# Patient Record
Sex: Female | Born: 1955 | Race: White | Hispanic: No | Marital: Married | Smoking: Former smoker
Health system: Southern US, Community
[De-identification: ages and names within clinical notes are randomized; demographics above are authoritative.]

## PROBLEM LIST (undated history)

## (undated) DIAGNOSIS — G90512 Complex regional pain syndrome I of left upper limb: Secondary | ICD-10-CM

## (undated) DIAGNOSIS — G5600 Carpal tunnel syndrome, unspecified upper limb: Secondary | ICD-10-CM

## (undated) DIAGNOSIS — G43909 Migraine, unspecified, not intractable, without status migrainosus: Secondary | ICD-10-CM

## (undated) DIAGNOSIS — H33329 Round hole, unspecified eye: Secondary | ICD-10-CM

## (undated) DIAGNOSIS — I214 Non-ST elevation (NSTEMI) myocardial infarction: Secondary | ICD-10-CM

## (undated) DIAGNOSIS — R011 Cardiac murmur, unspecified: Secondary | ICD-10-CM

## (undated) HISTORY — PX: TUBAL LIGATION: SHX77

## (undated) HISTORY — PX: CARPAL TUNNEL RELEASE: SHX101

## (undated) HISTORY — PX: VAGINAL HYSTERECTOMY: SUR661

## (undated) HISTORY — PX: RHINOPLASTY: SUR1284

---
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
## 2017-05-01 ENCOUNTER — Emergency Department (HOSPITAL_COMMUNITY): Payer: 59

## 2017-05-01 ENCOUNTER — Inpatient Hospital Stay (HOSPITAL_COMMUNITY)
Admission: EM | Admit: 2017-05-01 | Discharge: 2017-05-04 | DRG: 286 | Disposition: A | Discharge status: Home / Self Care | Payer: 59 | Attending: Internal Medicine | Admitting: Internal Medicine

## 2017-05-01 ENCOUNTER — Encounter (HOSPITAL_COMMUNITY): Payer: Self-pay

## 2017-05-01 ENCOUNTER — Encounter (HOSPITAL_COMMUNITY)
Admission: EM | Disposition: A | Discharge status: Home / Self Care | Payer: Self-pay | Source: Home / Self Care | Attending: Internal Medicine

## 2017-05-01 DIAGNOSIS — Z79891 Long term (current) use of opiate analgesic: Secondary | ICD-10-CM

## 2017-05-01 DIAGNOSIS — I4581 Long QT syndrome: Secondary | ICD-10-CM | POA: Diagnosis present

## 2017-05-01 DIAGNOSIS — I5181 Takotsubo syndrome: Principal | ICD-10-CM

## 2017-05-01 DIAGNOSIS — I214 Non-ST elevation (NSTEMI) myocardial infarction: Secondary | ICD-10-CM | POA: Diagnosis present

## 2017-05-01 DIAGNOSIS — Z888 Allergy status to other drugs, medicaments and biological substances status: Secondary | ICD-10-CM

## 2017-05-01 DIAGNOSIS — Z79899 Other long term (current) drug therapy: Secondary | ICD-10-CM

## 2017-05-01 DIAGNOSIS — G90512 Complex regional pain syndrome I of left upper limb: Secondary | ICD-10-CM | POA: Diagnosis present

## 2017-05-01 DIAGNOSIS — M79602 Pain in left arm: Secondary | ICD-10-CM | POA: Diagnosis not present

## 2017-05-01 DIAGNOSIS — M25511 Pain in right shoulder: Secondary | ICD-10-CM | POA: Diagnosis present

## 2017-05-01 DIAGNOSIS — F17210 Nicotine dependence, cigarettes, uncomplicated: Secondary | ICD-10-CM | POA: Diagnosis present

## 2017-05-01 DIAGNOSIS — G894 Chronic pain syndrome: Secondary | ICD-10-CM | POA: Diagnosis present

## 2017-05-01 DIAGNOSIS — G9050 Complex regional pain syndrome I, unspecified: Secondary | ICD-10-CM

## 2017-05-01 DIAGNOSIS — R791 Abnormal coagulation profile: Secondary | ICD-10-CM | POA: Diagnosis present

## 2017-05-01 DIAGNOSIS — I2510 Atherosclerotic heart disease of native coronary artery without angina pectoris: Secondary | ICD-10-CM | POA: Diagnosis present

## 2017-05-01 DIAGNOSIS — I361 Nonrheumatic tricuspid (valve) insufficiency: Secondary | ICD-10-CM | POA: Diagnosis not present

## 2017-05-01 DIAGNOSIS — Z90710 Acquired absence of both cervix and uterus: Secondary | ICD-10-CM | POA: Diagnosis not present

## 2017-05-01 DIAGNOSIS — I5021 Acute systolic (congestive) heart failure: Secondary | ICD-10-CM | POA: Diagnosis present

## 2017-05-01 HISTORY — DX: Migraine, unspecified, not intractable, without status migrainosus: G43.909

## 2017-05-01 HISTORY — DX: Cardiac murmur, unspecified: R01.1

## 2017-05-01 HISTORY — DX: Non-ST elevation (NSTEMI) myocardial infarction: I21.4

## 2017-05-01 HISTORY — PX: LEFT HEART CATH AND CORONARY ANGIOGRAPHY: CATH118249

## 2017-05-01 HISTORY — DX: Carpal tunnel syndrome, unspecified upper limb: G56.00

## 2017-05-01 HISTORY — PX: CARDIAC CATHETERIZATION: SHX172

## 2017-05-01 HISTORY — DX: Round hole, unspecified eye: H33.329

## 2017-05-01 HISTORY — DX: Complex regional pain syndrome i of left upper limb: G90.512

## 2017-05-01 LAB — CBC
HEMATOCRIT: 45.7 % (ref 36.0–46.0)
Hemoglobin: 15.8 g/dL — ABNORMAL HIGH (ref 12.0–15.0)
MCH: 31.4 pg (ref 26.0–34.0)
MCHC: 34.6 g/dL (ref 30.0–36.0)
MCV: 90.9 fL (ref 78.0–100.0)
PLATELETS: 206 10*3/uL (ref 150–400)
RBC: 5.03 MIL/uL (ref 3.87–5.11)
RDW: 12.7 % (ref 11.5–15.5)
WBC: 21.3 10*3/uL — AB (ref 4.0–10.5)

## 2017-05-01 LAB — COMPREHENSIVE METABOLIC PANEL
ALBUMIN: 4.3 g/dL (ref 3.5–5.0)
ALT: 19 U/L (ref 14–54)
AST: 70 U/L — AB (ref 15–41)
Alkaline Phosphatase: 115 U/L (ref 38–126)
Anion gap: 10 (ref 5–15)
BILIRUBIN TOTAL: 0.7 mg/dL (ref 0.3–1.2)
BUN: 9 mg/dL (ref 6–20)
CHLORIDE: 103 mmol/L (ref 101–111)
CO2: 26 mmol/L (ref 22–32)
CREATININE: 0.82 mg/dL (ref 0.44–1.00)
Calcium: 9.7 mg/dL (ref 8.9–10.3)
GFR calc Af Amer: >60 mL/min (ref >60)
GLUCOSE: 130 mg/dL — AB (ref 65–99)
Potassium: 4.2 mmol/L (ref 3.5–5.1)
Sodium: 139 mmol/L (ref 135–145)
Total Protein: 7.6 g/dL (ref 6.5–8.1)

## 2017-05-01 LAB — CBG MONITORING, ED: GLUCOSE-CAPILLARY: 114 mg/dL — AB (ref 65–99)

## 2017-05-01 LAB — APTT: aPTT: 36 seconds (ref 24–36)

## 2017-05-01 LAB — PROTIME-INR
INR: 0.98
Prothrombin Time: 12.9 seconds (ref 11.4–15.2)

## 2017-05-01 LAB — D-DIMER, QUANTITATIVE: D-Dimer, Quant: 3.47 ug/mL-FEU — ABNORMAL HIGH (ref 0.00–0.50)

## 2017-05-01 LAB — TROPONIN I: Troponin I: 8.99 ng/mL (ref <0.03)

## 2017-05-01 SURGERY — LEFT HEART CATH AND CORONARY ANGIOGRAPHY
Anesthesia: LOCAL

## 2017-05-01 MED ORDER — SODIUM CHLORIDE 0.9% FLUSH: 3.0000 mL | INTRAVENOUS | Status: DC | PRN

## 2017-05-01 MED ORDER — FENTANYL CITRATE (PF) 100 MCG/2ML IJ SOLN
50.0000 ug | Freq: Once | INTRAMUSCULAR | Status: AC
  Administered 2017-05-01: 50 ug via INTRAVENOUS
  Filled 2017-05-01: qty 2

## 2017-05-01 MED ORDER — SODIUM CHLORIDE 0.9% FLUSH: 3.0000 mL | Freq: Two times a day (BID) | INTRAVENOUS | Status: DC

## 2017-05-01 MED ORDER — LIDOCAINE HCL 2 % IJ SOLN
INTRAMUSCULAR | Status: AC
  Filled 2017-05-01: qty 10

## 2017-05-01 MED ORDER — HEPARIN BOLUS VIA INFUSION
3000.0000 [IU] | Freq: Once | INTRAVENOUS | Status: AC
  Administered 2017-05-01: 3000 [IU] via INTRAVENOUS

## 2017-05-01 MED ORDER — HYDROCODONE-ACETAMINOPHEN 10-325 MG PO TABS
1.0000 | ORAL_TABLET | Freq: Three times a day (TID) | ORAL | Status: DC
  Administered 2017-05-01 – 2017-05-02 (×2): 1 via ORAL
  Filled 2017-05-01 (×2): qty 1

## 2017-05-01 MED ORDER — VERAPAMIL HCL 2.5 MG/ML IV SOLN
INTRAVENOUS | Status: AC
  Filled 2017-05-01: qty 2

## 2017-05-01 MED ORDER — ACETAMINOPHEN 325 MG PO TABS
650.0000 mg | ORAL_TABLET | ORAL | Status: DC | PRN
  Administered 2017-05-03: 650 mg via ORAL
  Filled 2017-05-01: qty 2

## 2017-05-01 MED ORDER — NITROGLYCERIN 0.4 MG SL SUBL
0.4000 mg | SUBLINGUAL_TABLET | SUBLINGUAL | Status: DC | PRN
Start: 2017-05-01 — End: 2017-05-01

## 2017-05-01 MED ORDER — MIDAZOLAM HCL 2 MG/2ML IJ SOLN
INTRAMUSCULAR | Status: AC
  Filled 2017-05-01: qty 2

## 2017-05-01 MED ORDER — LIDOCAINE HCL (PF) 1 % IJ SOLN
INTRAMUSCULAR | Status: DC | PRN
Start: 2017-05-01 — End: 2017-05-01
  Administered 2017-05-01: 2 mL via INTRADERMAL

## 2017-05-01 MED ORDER — HEPARIN (PORCINE) IN NACL 100-0.45 UNIT/ML-% IJ SOLN
750.0000 [IU]/h | INTRAMUSCULAR | Status: DC
  Administered 2017-05-01: 750 [IU]/h via INTRAVENOUS
  Filled 2017-05-01: qty 250

## 2017-05-01 MED ORDER — NITROGLYCERIN IN D5W 200-5 MCG/ML-% IV SOLN
5.0000 ug/min | Freq: Once | INTRAVENOUS | Status: AC
  Administered 2017-05-01: 10 ug/min via INTRAVENOUS

## 2017-05-01 MED ORDER — ASPIRIN 81 MG PO CHEW: 81.0000 mg | CHEWABLE_TABLET | ORAL | Status: DC

## 2017-05-01 MED ORDER — SODIUM CHLORIDE 0.9 % IV SOLN
250.0000 mL | INTRAVENOUS | Status: DC | PRN
  Administered 2017-05-03: 250 mL via INTRAVENOUS

## 2017-05-01 MED ORDER — MIDAZOLAM HCL 2 MG/2ML IJ SOLN
INTRAMUSCULAR | Status: DC | PRN
  Administered 2017-05-01 (×2): 1 mg via INTRAVENOUS

## 2017-05-01 MED ORDER — SODIUM CHLORIDE 0.9 % IV SOLN
250.0000 mL | INTRAVENOUS | Status: DC | PRN
Start: 2017-05-01 — End: 2017-05-01

## 2017-05-01 MED ORDER — SODIUM CHLORIDE 0.9 % IV BOLUS (SEPSIS)
500.0000 mL | Freq: Once | INTRAVENOUS | Status: AC
  Administered 2017-05-01: 500 mL via INTRAVENOUS

## 2017-05-01 MED ORDER — SODIUM CHLORIDE 0.9 % IV SOLN: 250.0000 mL | INTRAVENOUS | Status: DC | PRN

## 2017-05-01 MED ORDER — HEPARIN (PORCINE) IN NACL 2-0.9 UNIT/ML-% IJ SOLN
INTRAMUSCULAR | Status: AC
  Filled 2017-05-01: qty 1000

## 2017-05-01 MED ORDER — FENTANYL CITRATE (PF) 100 MCG/2ML IJ SOLN
INTRAMUSCULAR | Status: AC
  Filled 2017-05-01: qty 2

## 2017-05-01 MED ORDER — ONDANSETRON HCL 4 MG/2ML IJ SOLN
4.0000 mg | Freq: Four times a day (QID) | INTRAMUSCULAR | Status: DC | PRN
  Administered 2017-05-03: 03:00:00 4 mg via INTRAVENOUS
  Filled 2017-05-01: qty 2

## 2017-05-01 MED ORDER — SODIUM CHLORIDE 0.9% FLUSH
3.0000 mL | INTRAVENOUS | Status: DC | PRN
Start: 2017-05-01 — End: 2017-05-01

## 2017-05-01 MED ORDER — SODIUM CHLORIDE 0.9 % WEIGHT BASED INFUSION: 1.0000 mL/kg/h | INTRAVENOUS | Status: DC

## 2017-05-01 MED ORDER — ENOXAPARIN SODIUM 40 MG/0.4ML ~~LOC~~ SOLN
40.0000 mg | SUBCUTANEOUS | Status: DC
  Administered 2017-05-02 – 2017-05-04 (×3): 40 mg via SUBCUTANEOUS
  Filled 2017-05-01 (×3): qty 0.4

## 2017-05-01 MED ORDER — IOPAMIDOL (ISOVUE-370) INJECTION 76%
INTRAVENOUS | Status: DC | PRN
Start: 2017-05-01 — End: 2017-05-01
  Administered 2017-05-01: 55 mL via INTRA_ARTERIAL

## 2017-05-01 MED ORDER — SODIUM CHLORIDE 0.9% FLUSH
3.0000 mL | Freq: Two times a day (BID) | INTRAVENOUS | Status: DC
  Administered 2017-05-02 – 2017-05-04 (×3): 3 mL via INTRAVENOUS

## 2017-05-01 MED ORDER — HEPARIN SODIUM (PORCINE) 1000 UNIT/ML IJ SOLN
INTRAMUSCULAR | Status: DC | PRN
Start: 2017-05-01 — End: 2017-05-01
  Administered 2017-05-01: 2500 [IU] via INTRAVENOUS

## 2017-05-01 MED ORDER — ANGIOPLASTY BOOK
Freq: Once | Status: AC
  Administered 2017-05-01: 20:00:00
  Filled 2017-05-01: qty 1

## 2017-05-01 MED ORDER — ASPIRIN 81 MG PO CHEW
324.0000 mg | CHEWABLE_TABLET | Freq: Once | ORAL | Status: AC
  Administered 2017-05-01: 324 mg via ORAL
  Filled 2017-05-01: qty 4

## 2017-05-01 MED ORDER — SODIUM CHLORIDE 0.9 % WEIGHT BASED INFUSION: 3.0000 mL/kg/h | INTRAVENOUS | Status: DC

## 2017-05-01 MED ORDER — IOPAMIDOL (ISOVUE-370) INJECTION 76%
INTRAVENOUS | Status: AC
  Filled 2017-05-01: qty 100

## 2017-05-01 MED ORDER — FENTANYL CITRATE (PF) 100 MCG/2ML IJ SOLN
INTRAMUSCULAR | Status: DC | PRN
  Administered 2017-05-01 (×2): 25 ug via INTRAVENOUS
  Administered 2017-05-01: 50 ug via INTRAVENOUS

## 2017-05-01 MED ORDER — HEPARIN (PORCINE) IN NACL 2-0.9 UNIT/ML-% IJ SOLN
INTRAMUSCULAR | Status: AC | PRN
  Administered 2017-05-01: 1000 mL via INTRA_ARTERIAL

## 2017-05-01 MED ORDER — HEPARIN (PORCINE) IN NACL 2-0.9 UNIT/ML-% IJ SOLN
INTRAMUSCULAR | Status: DC | PRN
  Administered 2017-05-01: 18:00:00 via INTRA_ARTERIAL

## 2017-05-01 MED ORDER — DIAZEPAM 5 MG PO TABS: 10.0000 mg | ORAL_TABLET | Freq: Every evening | ORAL | Status: DC | PRN

## 2017-05-01 MED ORDER — FUROSEMIDE 10 MG/ML IJ SOLN
20.0000 mg | Freq: Every day | INTRAMUSCULAR | Status: DC
  Administered 2017-05-01 – 2017-05-02 (×2): 20 mg via INTRAVENOUS
  Filled 2017-05-01 (×2): qty 2

## 2017-05-01 MED ORDER — HEPARIN SODIUM (PORCINE) 1000 UNIT/ML IJ SOLN
INTRAMUSCULAR | Status: AC
  Filled 2017-05-01: qty 1

## 2017-05-01 MED ORDER — NITROGLYCERIN 0.4 MG SL SUBL
SUBLINGUAL_TABLET | SUBLINGUAL | Status: AC
  Administered 2017-05-01: 0.4 mg
  Filled 2017-05-01: qty 3

## 2017-05-01 MED ORDER — HEART ATTACK BOUNCING BOOK
Freq: Once | Status: AC
  Administered 2017-05-01: 20:00:00
  Filled 2017-05-01: qty 1

## 2017-05-01 MED ORDER — CARVEDILOL 3.125 MG PO TABS
3.1250 mg | ORAL_TABLET | Freq: Two times a day (BID) | ORAL | Status: DC
  Administered 2017-05-01 – 2017-05-02 (×3): 3.125 mg via ORAL
  Filled 2017-05-01 (×3): qty 1

## 2017-05-01 MED ORDER — NITROGLYCERIN 0.4 MG SL SUBL
SUBLINGUAL_TABLET | SUBLINGUAL | Status: AC
  Administered 2017-05-01: 0.4 mg
  Filled 2017-05-01: qty 1

## 2017-05-01 MED ORDER — SODIUM CHLORIDE 0.9 % WEIGHT BASED INFUSION: 3.0000 mL/kg/h | INTRAVENOUS | Status: AC

## 2017-05-01 MED ORDER — NITROGLYCERIN IN D5W 200-5 MCG/ML-% IV SOLN
INTRAVENOUS | Status: AC
  Filled 2017-05-01: qty 250

## 2017-05-01 SURGICAL SUPPLY — 11 items
CATH IMPULSE 5F ANG/FL3.5 (CATHETERS) ×2 IMPLANT
DEVICE RAD TR BAND REGULAR (VASCULAR PRODUCTS) ×2 IMPLANT
GLIDESHEATH SLEND SS 6F .021 (SHEATH) ×2 IMPLANT
GUIDEWIRE ANGLED .035X150CM (WIRE) ×2 IMPLANT
GUIDEWIRE INQWIRE 1.5J.035X260 (WIRE) ×1 IMPLANT
INQWIRE 1.5J .035X260CM (WIRE) ×2
KIT HEART LEFT (KITS) ×2 IMPLANT
PACK CARDIAC CATHETERIZATION (CUSTOM PROCEDURE TRAY) ×2 IMPLANT
TRANSDUCER W/STOPCOCK (MISCELLANEOUS) ×2 IMPLANT
TUBING CIL FLEX 10 FLL-RA (TUBING) ×2 IMPLANT
WIRE HI TORQ VERSACORE-J 145CM (WIRE) ×2 IMPLANT

## 2017-05-01 NOTE — Progress Notes (Signed)
ANTICOAGULATION CONSULT NOTE - Initial Consult  Pharmacy Consult for HEPARIN Indication: chest pain/ACS  Allergies  Allergen Reactions  . Haloperidol And Related     Tongue Swelling    Patient Measurements: Height: 5\' 3"  (160 cm) Weight: 115 lb (52.2 kg) IBW/kg (Calculated) : 52.4 HEPARIN DW (KG): 52.2  Vital Signs: Temp: 98.1 F (36.7 C) (10/18 1307) Temp Source: Oral (10/18 1307) BP: 128/97 (10/18 1307) Pulse Rate: 64 (10/18 1315)  Labs:  Recent Labs  05/01/17 1307  HGB 15.8*  HCT 45.7  PLT 206   CrCl cannot be calculated (No order found.).   Medical History: Past Medical History:  Diagnosis Date  . Carpal tunnel syndrome   . Reflex sympathetic dystrophy of left upper extremity   . Round hole    In heart   Medications:   (Not in a hospital admission)  Assessment: 61yo female c/o CP.  Asked to initiate Heparin for ACS.   Goal of Therapy:  Heparin level 0.3-0.7 units/ml Monitor platelets by anticoagulation protocol: Yes   Plan:   Heparin 3000 units IV now x 1  Heparin infusion at 750 units/hr  Heparin level in 6-8 hrs then daily  CBC daily while on Heparin   Valrie HartHall, Sharna Gabrys A 05/01/2017,1:35 PM

## 2017-05-01 NOTE — ED Notes (Signed)
Report given to Brise, RN for CathLab at [facility] at this time.

## 2017-05-01 NOTE — Progress Notes (Signed)
Pt to procedure area for Heart Cath via stretcher.

## 2017-05-01 NOTE — Progress Notes (Addendum)
Received pt from [facility] via [facility] alert and IV fluids infusing.  Pt on monitor and PA in to see pt. CP level 1 out of 10 per patient.  Dr End to see Pt.

## 2017-05-01 NOTE — Care Management Note (Signed)
Case Management Note  Patient Details  Name: Sandra Salinas MRN: [id number] Date of Birth: [birth year]  Subjective/Objective:    From home, s/p left heart cath /angiography, per cath report, Mild, non-obstructive coronary artery disease involving the LAD plan for medical txt.                  Action/Plan: NCM will follow for dc needs.  Expected Discharge Date:                  Expected Discharge Plan:  Home/Self Care  In-House Referral:     Discharge planning Services  CM Consult  Post Acute Care Choice:    Choice offered to:     DME Arranged:    DME Agency:     HH Arranged:    HH Agency:     Status of Service:  Completed, signed off  If discussed at [employer] Length of Stay Meetings, dates discussed:    Additional Comments:  Leone Havenaylor, Gray Maugeri Clinton, RN 05/01/2017, 7:52 PM

## 2017-05-01 NOTE — ED Triage Notes (Signed)
Pt stated that she had central and left chest pain that started yesterday. States she was pulling her shirt over her head and pain started in her chest, left arm, and ear pain. States that chest pain is tightness and has been there since 12pm

## 2017-05-01 NOTE — ED Provider Notes (Signed)
Emergency Department Provider Note   I have reviewed the triage vital signs and the nursing notes.   HISTORY  Chief Complaint Chest Pain   HPI Sandra Salinas is a 61 y.o. female with a past medical history of RSD and smoking who presents to the emergency department secondary to left chest and left arm pain. Patient states that this started sometime yesterday as a pressure in her left chest that has since traveled into her left arm. She has history of RSD in her left arm but states this feels different as long lasting longer. She has associated shortness of breath and nausea but no vomiting, diaphoresis or syncope. She's taken her pain medications usually helps with RSD which did not seem to help with this. She does not have any recent fever or cough. The pain does seem to be somewhat pleuritic but still describes it as pressure. States she's never had a known coronary issues. No other recent illnesses. No other associated or modifying symptoms. Pain started at rest.   Past Medical History:  Diagnosis Date  . Carpal tunnel syndrome   . Reflex sympathetic dystrophy of left upper extremity   . Round hole    In heart    There are no active problems to display for this patient.   Past Surgical History:  Procedure Laterality Date  . RHINOPLASTY    . TONSILLECTOMY  1963    Current Outpatient Rx  . Order #: [id number] Class: Historical Med    Allergies Haloperidol and related  No family history on file.  Social History Social History  Substance Use Topics  . Smoking status: Current Every Day Smoker    Packs/day: 0.50    Types: Cigarettes  . Smokeless tobacco: Never Used  . Alcohol use No    Review of Systems  All other systems negative except as documented in the HPI. All pertinent positives and negatives as reviewed in the HPI. ____________________________________________   PHYSICAL EXAM:  VITAL SIGNS: ED Triage Vitals  Enc Vitals Group     BP 05/01/17 1307  (!) 128/97     Pulse Rate 05/01/17 1307 65     Resp --      Temp 05/01/17 1307 98.1 F (36.7 C)     Temp Source 05/01/17 1307 Oral     SpO2 05/01/17 1307 99 %     Weight 05/01/17 1308 115 lb (52.2 kg)     Height 05/01/17 1308 5\' 3"  (1.6 m)     Head Circumference --      Peak Flow --      Pain Score --      Pain Loc --      Pain Edu? --      Excl. in GC? --     Constitutional: Alert and oriented. Well appearing and in no acute distress. Eyes: Conjunctivae are normal. PERRL. EOMI. Head: Atraumatic. Nose: No congestion/rhinnorhea. Mouth/Throat: Mucous membranes are moist.  Oropharynx non-erythematous. Neck: No stridor.  No meningeal signs.   Cardiovascular: Normal rate, regular rhythm. Good peripheral circulation. Grossly normal heart sounds.   Respiratory: Normal respiratory effort.  No retractions. Lungs CTAB. Gastrointestinal: Soft and nontender. No distention.  Musculoskeletal: No lower extremity tenderness nor edema. No gross deformities of extremities. Neurologic:  Normal speech and language. No gross focal neurologic deficits are appreciated.  Skin:  Skin is warm, dry and intact. No rash noted.   ____________________________________________   LABS (all labs ordered are listed, but only abnormal results are displayed)  Labs Reviewed  CBC  TROPONIN I  COMPREHENSIVE METABOLIC PANEL  CBC  D-DIMER, QUANTITATIVE (NOT AT [facility])  CBG MONITORING, ED   ____________________________________________  EKG   EKG Interpretation  Date/Time:  Thursday May 01 2017 12:57:27 EDT Ventricular Rate:  68 PR Interval:    QRS Duration: 93 QT Interval:  507 QTC Calculation: 540 R Axis:   51 Text Interpretation:  Sinus rhythm Abnormal T, probable ischemia, widespread Minimal ST elevation, inferior leads Prolonged QT interval Baseline wander in lead(s) V6 No old tracing to compare Confirmed by Marily Memos [phone]) on 05/01/2017 1:00:31 PM       EKG  Interpretation  Date/Time:  Thursday May 01 2017 12:57:27 EDT Ventricular Rate:  68 PR Interval:    QRS Duration: 93 QT Interval:  507 QTC Calculation: 540 R Axis:   51 Text Interpretation:  Sinus rhythm Abnormal T, probable ischemia, widespread Minimal ST elevation, inferior leads Prolonged QT interval Baseline wander in lead(s) V6 No old tracing to compare Confirmed by Marily Memos [phone]) on 05/01/2017 1:00:31 PM        ____________________________________________  RADIOLOGY  No results found.  ____________________________________________   PROCEDURES  Procedure(s) performed:   Procedures  CRITICAL CARE Performed by: Marily Memos Total critical care time: 45 minutes Critical care time was exclusive of separately billable procedures and treating other patients. Critical care was necessary to treat or prevent imminent or life-threatening deterioration. Critical care was time spent personally by me on the following activities: development of treatment plan with patient and/or surrogate as well as nursing, discussions with consultants, evaluation of patient's response to treatment, examination of patient, obtaining history from patient or surrogate, ordering and performing treatments and interventions, ordering and review of laboratory studies, ordering and review of radiographic studies, pulse oximetry and re-evaluation of patient's condition.  ____________________________________________   INITIAL IMPRESSION / ASSESSMENT AND PLAN / ED COURSE  Pertinent labs & imaging results that were available during my care of the patient were reviewed by me and considered in my medical decision making (see chart for details).  ECG with what appears to be possible bowling infarct. She has mild ST elevation in 23 aVF, V4 V5 and V6 with associated inverted T waves. These are all just under 1 mm so will not activate code STEMI at this time. We'll start heparin all waiting for  troponin come back. She also has a pleuritic nature to this and a history of smoking so we'll consider pulmonary was as well get a d-dimer. This is mostly inferior with some lateral as well but no reciprocal depressions I will discuss with cardiology and get a repeat EKG in approximately 20 minutes after the first one. Symptoms could also be related to RSD vs bronchitis vs multiple other benign etiologies but with this extent of ECG changes and no old ones to compare, will error on side of treating for ACS.   Primary turned at 8.99. Already started heparin. He had aspirin.Pain not much better with fentanyl. I discussed with Dr. Rennis Golden with cardiology at [facility] who agrees with transfer for catheterization. Is not concerned about any right ventricle involvement so we'll give a dose of nitroglycerin to see if it helps with her discomfort. Patient and spouse updated on the plan. Nothing by mouth at this time.   ____________________________________________  FINAL CLINICAL IMPRESSION(S) / ED DIAGNOSES  Final diagnoses:  NSTEMI (non-ST elevated myocardial infarction) (HCC)     MEDICATIONS GIVEN DURING THIS VISIT:  Medications  aspirin chewable tablet  324 mg (not administered)  fentaNYL (SUBLIMAZE) injection 50 mcg (not administered)     NEW OUTPATIENT MEDICATIONS STARTED DURING THIS VISIT:  New Prescriptions   No medications on file    Note:  This document was prepared using Dragon voice recognition software and may include unintentional dictation errors.   Marily MemosMesner, Maanya Hippert, MD 05/01/17 2241

## 2017-05-01 NOTE — Progress Notes (Signed)
TR BAND REMOVAL  LOCATION:    right radial  DEFLATED PER PROTOCOL:    Yes.    TIME BAND OFF / DRESSING APPLIED:    21:30   SITE UPON ARRIVAL:    Level 0  SITE AFTER BAND REMOVAL:    Level 0  CIRCULATION SENSATION AND MOVEMENT:    Within Normal Limits   Yes.    COMMENTS:   Post TR band instructions given. Pt tolerated well. 

## 2017-05-01 NOTE — ED Notes (Signed)
CRITICAL VALUE ALERT  Critical Value:  Trop 8.99  Date & Time Notied:  05/01/17  Provider Notified: Dr. Clayborne DanaMesner  Orders Received/Actions taken: no new orders

## 2017-05-01 NOTE — ED Notes (Signed)
Report given to [facility] with [facility]. ETA 20 minutes

## 2017-05-01 NOTE — Interval H&P Note (Signed)
History and Physical Interval Note:  05/01/2017 5:01 PM  Sandra HolmsSusan S Salinas  has presented today for cardiac catheterization, with the diagnosis of NSTEMI. The various methods of treatment have been discussed with the patient and family. After consideration of risks, benefits and other options for treatment, the patient has consented to  Procedure(s): LEFT HEART CATH AND CORONARY ANGIOGRAPHY (N/A) as a surgical intervention .  The patient's history has been reviewed, patient examined, no change in status, stable for surgery.  I have reviewed the patient's chart and labs.  Questions were answered to the patient's satisfaction.    Cath Lab Visit (complete for each Cath Lab visit)  Clinical Evaluation Leading to the Procedure:   ACS: Yes.    Non-ACS:  N/A  Devra Stare

## 2017-05-01 NOTE — Progress Notes (Signed)
    Discussed medication options with pharmacy, as we plan to hold her methadone. Given this has a long half life, will plan to use PRN pain medication if needed. Can touch base with pain clinic in the am if needed to help dose adjust while inpatient.   SignedLaverda Page, Melenda Bielak, NP-C 05/01/2017, 5:16 PM Pager: [phone]

## 2017-05-01 NOTE — H&P (Signed)
History & Physical    Patient ID: Sandra Salinas MRN: [id number], DOB/AGE: 1956-01-27   Admit date: 05/01/2017   Primary Physician: Samuel JesterButler, Cynthia, DO Primary Cardiologist: End   Patient Profile    61 yo female with PMH of RSD affecting the left arm, and tobacco use who presented to [facility] with chest pain.  Past Medical History   Past Medical History:  Diagnosis Date  . Carpal tunnel syndrome   . Reflex sympathetic dystrophy of left upper extremity   . Round hole    In heart    Past Surgical History:  Procedure Laterality Date  . RHINOPLASTY    . TONSILLECTOMY  1963     Allergies  Allergies  Allergen Reactions  . Haloperidol And Related     Tongue Swelling     History of Present Illness    Sandra Salinas is a 61 yo female with PMH of chronic pain from RSD affecting the left arm and tobacco use. Reports she has been taking multiple narcotic pain medications including methadone for many years. Denies any significant family hx of CAD, but brother has some type of arrhythmia. States she has been having intermittent episodes of sharp chest pain with tightness over the past several weeks, but thought this was related to GERD. Yesterday she woke up around 12pm and had this chest tightness again. Took her chronic pain medications but symptoms persisted. This morning the pain continued and she became dyspneic.   [facility] with symptoms. Labs showed stable electrolytes, Trop 8.99, Hgb 15.8, Ddimer 3.47. EKG showed TWI in inferior and lateral leads. She was started on IV heparin and nitro and transferred to the ED for further work up and cath.   Home Medications    Prior to Admission medications   Medication Sig Start Date End Date Taking? Authorizing Provider  HYDROcodone-acetaminophen (NORCO) 10-325 MG tablet Take 1 tablet by mouth every 8 (eight) hours. 04/15/17  Yes [provider]  methadone (DOLOPHINE) 10 MG tablet Take 10 mg by mouth every  8 (eight) hours.   Yes [provider]  Multiple Vitamins-Minerals (WOMENS 50+ MULTI VITAMIN/MIN PO) Take 1 tablet by mouth daily.   Yes [provider]    Family History     Family History  Problem Relation Age of Onset  . Hypertension Father     Social History    Social History   Social History  . Marital status: Married    Spouse name: N/A  . Number of children: N/A  . Years of education: N/A   Occupational History  . Not on file.   Social History Main Topics  . Smoking status: Current Every Day Smoker    Packs/day: 0.50    Types: Cigarettes  . Smokeless tobacco: Never Used  . Alcohol use No  . Drug use: No  . Sexual activity: Not on file   Other Topics Concern  . Not on file   Social History Narrative  . No narrative on file     Review of Systems    See HPI  All other systems reviewed and are otherwise negative except as noted above.  Physical Exam    Blood pressure 131/73, pulse (!) 55, temperature 98.1 F (36.7 C), resp. rate 20, height 5\' 3"  (1.6 m), weight 115 lb (52.2 kg), SpO2 98 %.  General: Pleasant, NAD Psych: Normal affect. Neuro: Alert and oriented X 3. Moves all extremities spontaneously. HEENT: Normal  Neck: Supple without  bruits or JVD. Lungs:  Resp regular and unlabored, CTA. Heart: RRR no s3, s4, or murmurs. Abdomen: Soft, non-tender, non-distended, BS + x 4.  Extremities: No clubbing, cyanosis or edema. DP/PT/Radials 2+ and equal bilaterally.  Labs    Troponin (Point of Care Test) No results for input(s): TROPIPOC in the last 72 hours.  Recent Labs  05/01/17 1308  TROPONINI 8.99*   Lab Results  Component Value Date   WBC 21.3 (H) 05/01/2017   HGB 15.8 (H) 05/01/2017   HCT 45.7 05/01/2017   MCV 90.9 05/01/2017   PLT 206 05/01/2017     Recent Labs Lab 05/01/17 1307  NA 139  K 4.2  CL 103  CO2 26  BUN 9  CREATININE 0.82  CALCIUM 9.7  PROT 7.6  BILITOT 0.7  ALKPHOS 115  ALT 19  AST 70*    GLUCOSE 130*   No results found for: CHOL, HDL, LDLCALC, TRIG Lab Results  Component Value Date   DDIMER 3.47 (H) 05/01/2017     Radiology Studies    Dg Chest 2 View  Result Date: 05/01/2017 CLINICAL DATA:  Chest pain EXAM: CHEST  2 VIEW COMPARISON:  None. FINDINGS: There is no edema or consolidation. The heart size and pulmonary vascularity are normal. No adenopathy. No pneumothorax. There is mild degenerative change in the thoracic spine. IMPRESSION: No edema or consolidation. Electronically Signed   By: Bretta Bang III M.D.   On: 05/01/2017 13:49    ECG & Cardiac Imaging    EKG: SR with TWI in inferior and lateral leads  Assessment & Plan    NSTEMI: Trop 8.99 at [facility]. EKG abnormal with ongoing chest pain. Started on IV heparin and nitro and transferred to [facility] for further work up. Further recommendations post cath.   Tobacco Use: discussed cessation  Signed, Laverda Page, NP-C Pager [phone] 05/01/2017, 4:48 PM

## 2017-05-02 ENCOUNTER — Encounter (HOSPITAL_COMMUNITY): Payer: Self-pay | Admitting: Internal Medicine

## 2017-05-02 ENCOUNTER — Inpatient Hospital Stay (HOSPITAL_COMMUNITY): Payer: 59

## 2017-05-02 DIAGNOSIS — G9050 Complex regional pain syndrome I, unspecified: Secondary | ICD-10-CM

## 2017-05-02 DIAGNOSIS — I361 Nonrheumatic tricuspid (valve) insufficiency: Secondary | ICD-10-CM

## 2017-05-02 DIAGNOSIS — I5181 Takotsubo syndrome: Principal | ICD-10-CM

## 2017-05-02 LAB — BASIC METABOLIC PANEL
ANION GAP: 8 (ref 5–15)
Anion gap: 9 (ref 5–15)
BUN: 9 mg/dL (ref 6–20)
BUN: 11 mg/dL (ref 6–20)
CHLORIDE: 102 mmol/L (ref 101–111)
CHLORIDE: 98 mmol/L — AB (ref 101–111)
CO2: 25 mmol/L (ref 22–32)
CO2: 29 mmol/L (ref 22–32)
Calcium: 9.2 mg/dL (ref 8.9–10.3)
Calcium: 9.3 mg/dL (ref 8.9–10.3)
Creatinine, Ser: 0.75 mg/dL (ref 0.44–1.00)
Creatinine, Ser: 0.9 mg/dL (ref 0.44–1.00)
GFR calc Af Amer: >60 mL/min (ref >60)
GFR calc Af Amer: >60 mL/min (ref >60)
GFR calc non Af Amer: >60 mL/min (ref >60)
GFR calc non Af Amer: >60 mL/min (ref >60)
GLUCOSE: 100 mg/dL — AB (ref 65–99)
GLUCOSE: 88 mg/dL (ref 65–99)
POTASSIUM: 3 mmol/L — AB (ref 3.5–5.1)
POTASSIUM: 3.6 mmol/L (ref 3.5–5.1)
Sodium: 135 mmol/L (ref 135–145)
Sodium: 136 mmol/L (ref 135–145)

## 2017-05-02 LAB — LIPID PANEL
CHOLESTEROL: 150 mg/dL (ref 0–200)
HDL: 37 mg/dL — AB (ref >40)
LDL Cholesterol: 101 mg/dL — ABNORMAL HIGH (ref 0–99)
TRIGLYCERIDES: 62 mg/dL (ref <150)
Total CHOL/HDL Ratio: 4.1 RATIO
VLDL: 12 mg/dL (ref 0–40)

## 2017-05-02 LAB — CBC
HCT: 40.4 % (ref 36.0–46.0)
Hemoglobin: 13.8 g/dL (ref 12.0–15.0)
MCH: 30.4 pg (ref 26.0–34.0)
MCHC: 34.2 g/dL (ref 30.0–36.0)
MCV: 89 fL (ref 78.0–100.0)
PLATELETS: 180 10*3/uL (ref 150–400)
RBC: 4.54 MIL/uL (ref 3.87–5.11)
RDW: 13 % (ref 11.5–15.5)
WBC: 13.5 10*3/uL — AB (ref 4.0–10.5)

## 2017-05-02 LAB — ECHOCARDIOGRAM COMPLETE
Height: 63 in
Weight: 1802.48 oz

## 2017-05-02 LAB — MAGNESIUM: Magnesium: 2.1 mg/dL (ref 1.7–2.4)

## 2017-05-02 MED ORDER — MAGNESIUM SULFATE 2 GM/50ML IV SOLN
2.0000 g | Freq: Once | INTRAVENOUS | Status: AC
  Administered 2017-05-02: 11:00:00 2 g via INTRAVENOUS
  Filled 2017-05-02: qty 50

## 2017-05-02 MED ORDER — DIAZEPAM 5 MG PO TABS
10.0000 mg | ORAL_TABLET | Freq: Every day | ORAL | Status: DC
  Administered 2017-05-03 (×2): 10 mg via ORAL
  Filled 2017-05-02 (×2): qty 2

## 2017-05-02 MED ORDER — MORPHINE SULFATE (PF) 4 MG/ML IV SOLN
2.0000 mg | INTRAVENOUS | Status: DC | PRN
  Administered 2017-05-02: 2 mg via INTRAVENOUS
  Administered 2017-05-02: 09:00:00 4 mg via INTRAVENOUS
  Administered 2017-05-02: 17:00:00 2 mg via INTRAVENOUS
  Filled 2017-05-02 (×3): qty 1

## 2017-05-02 MED ORDER — ASPIRIN EC 81 MG PO TBEC
81.0000 mg | DELAYED_RELEASE_TABLET | Freq: Every day | ORAL | Status: DC
  Administered 2017-05-02 – 2017-05-04 (×3): 81 mg via ORAL
  Filled 2017-05-02 (×3): qty 1

## 2017-05-02 MED ORDER — OXYCODONE HCL ER 10 MG PO T12A
20.0000 mg | EXTENDED_RELEASE_TABLET | Freq: Two times a day (BID) | ORAL | Status: DC
  Administered 2017-05-02 – 2017-05-04 (×5): 20 mg via ORAL
  Filled 2017-05-02: qty 2
  Filled 2017-05-02 (×2): qty 1
  Filled 2017-05-02: qty 2
  Filled 2017-05-02: qty 1

## 2017-05-02 MED ORDER — OXYCODONE HCL 5 MG PO TABS
10.0000 mg | ORAL_TABLET | ORAL | Status: DC | PRN
  Administered 2017-05-02 – 2017-05-04 (×4): 10 mg via ORAL
  Filled 2017-05-02 (×4): qty 2

## 2017-05-02 MED ORDER — POTASSIUM CHLORIDE CRYS ER 20 MEQ PO TBCR
40.0000 meq | EXTENDED_RELEASE_TABLET | Freq: Two times a day (BID) | ORAL | Status: AC
  Administered 2017-05-02 (×2): 40 meq via ORAL
  Filled 2017-05-02 (×2): qty 2

## 2017-05-02 MED FILL — Verapamil HCl IV Soln 2.5 MG/ML: INTRAVENOUS | Qty: 2 | Status: AC

## 2017-05-02 MED FILL — Lidocaine HCl Local Inj 2%: INTRAMUSCULAR | Qty: 10 | Status: AC

## 2017-05-02 NOTE — Progress Notes (Signed)
Dr. Excell Seltzerooper, in my presence, spoke with patient and husband  this morning about the danger methadone posed at this time.  Spoke with pharmacist this evening regarding the need for patient to avoid methadone secondary to QT prolongation.  They directed me to include as an allergy with comments, which I did.  Discussed with the patient and husband again this evening.  There is methadone at home, and they agreed not to use until cleared by cardiology.

## 2017-05-02 NOTE — Progress Notes (Signed)
CARDIAC REHAB PHASE I   PRE:  Rate/Rhythm: 60 SR    BP: sitting 94/56      MODE:  Ambulation: 250 ft   POST:  Rate/Rhythm: 72 SR    BP: sitting 107/62     SaO2:   Pt ambulated 250 ft. Pt walked slow and steady but was tired. After walk returned to recliner with blood pressure stable. No dizziness but did state her chest was sore. Ed completed with pt and husband. Smoking cessation, low sodium, MI restrictions, phase II CR. Pt understood importance of quitting smoking, she has cut down to 5 ciggarettes. Husband very supportive. Will refer to [name].  Pt was given takotsubo cardiomyopathy handout.   [id number]   Sandra MassonRandi Kristan Oluwaferanmi Salinas CES, ACSM 05/02/2017 10:37 AM

## 2017-05-02 NOTE — Progress Notes (Signed)
  Echocardiogram 2D Echocardiogram has been performed.  Sandra SavoyCasey N Morrell Salinas 05/02/2017, 3:44 PM

## 2017-05-02 NOTE — H&P (Addendum)
[facility] History and Physical  Sandra Salinas ZOX:[id number] DOB: 07/11/1956 DOA: 05/01/2017  Referring physician:  PCP: Samuel Jester, DO   Chief Complaint: QTC prolongation secondary to methadone  HPI:  61 year old female with a history of reflex sympathetic dystrophy, or for left upper extremity secondary to carpal tunnel surgery many years ago, non-ST elevation MI, found to have mild nonobstructive coronary artery disease and severely reduced LVEF with akinesis, suggestive of stress-induced cardiomyopathy, with a peak troponin of 8.99, found to have QTC of 567 on her EKG. Patient is followed by Dr. Samuel Jester will the family medicine physician in [place], who has been prescribing her the methadone. Previously she used to follow up with the pain clinic in [place] which has since closed down. Patient was noted to have a potassium of 3.0 this morning. Magnesium is ordered and pending. She was also found to have PVCs on her telemetry. Consult was requested for pain management given need for discontinuation of methadone and transition to another oral opioid. Patient has been on methadone for several years. He states that methadone controls her pain fairly well      Review of Systems: negative for the following  Constitutional: Denies fever, chills, diaphoresis, appetite change and fatigue.  HEENT: Denies photophobia, eye pain, redness, hearing loss, ear pain, congestion, sore throat, rhinorrhea, sneezing, mouth sores, trouble swallowing, neck pain, neck stiffness and tinnitus.  Respiratory: Denies SOB, DOE, cough, chest tightness, and wheezing.  Cardiovascular: Denies chest pain, palpitations and leg swelling.  Gastrointestinal: Denies nausea, vomiting, abdominal pain, diarrhea, constipation, blood in stool and abdominal distention.  Genitourinary: Denies dysuria, urgency, frequency, hematuria, flank pain and difficulty urinating.  Musculoskeletal: Complains of generalized  myalgias, particularly pain in her left wrist and left arm Skin: Denies pallor, rash and wound.  Neurological: Denies dizziness, seizures, syncope, weakness, light-headedness, numbness and headaches.  Hematological: Denies adenopathy. Easy bruising, personal or family bleeding history  Psychiatric/Behavioral: Denies suicidal ideation, mood changes, confusion, nervousness, sleep disturbance and agitation       Past Medical History:  Diagnosis Date  . Carpal tunnel syndrome   . Heart murmur   . Migraine    "none in the 2000s" (05/01/2017)  . NSTEMI (non-ST elevated myocardial infarction) (HCC) 05/01/2017  . Reflex sympathetic dystrophy of left upper extremity   . Round hole 12/08/1955   "in my heart; went away when I was 12"     Past Surgical History:  Procedure Laterality Date  . CARDIAC CATHETERIZATION  05/01/2017  . CARPAL TUNNEL RELEASE Left   . LEFT HEART CATH AND CORONARY ANGIOGRAPHY N/A 05/01/2017   Procedure: LEFT HEART CATH AND CORONARY ANGIOGRAPHY;  Surgeon: Yvonne Kendall, MD;  Location: [facility];  Service: Cardiovascular;  Laterality: N/A;  . RHINOPLASTY    . TONSILLECTOMY  1963  . TUBAL LIGATION    . VAGINAL HYSTERECTOMY        Social History:  reports that she has been smoking Cigarettes.  She has a 18.00 pack-year smoking history. She has never used smokeless tobacco. She reports that she drinks alcohol. She reports that she does not use drugs.    Allergies  Allergen Reactions  . Haloperidol And Related     Tongue Swelling     Family History  Problem Relation Age of Onset  . Hypertension Father          Prior to Admission medications   Medication Sig Start Date End Date Taking? Authorizing Provider  HYDROcodone-acetaminophen (NORCO) 10-325 MG tablet  Take 1 tablet by mouth every 8 (eight) hours. 04/15/17  Yes [provider]  methadone (DOLOPHINE) 10 MG tablet Take 10 mg by mouth every 8 (eight) hours.   Yes [provider]  Multiple Vitamins-Minerals (WOMENS 50+ MULTI VITAMIN/MIN PO) Take 1 tablet by mouth daily.   Yes [provider]     Physical Exam: Vitals:   05/01/17 2200 05/02/17 0043 05/02/17 0351 05/02/17 0700  BP: (!) 109/58 96/68 (!) 108/58 (!) 102/56  Pulse: (!) 51   68  Resp: (!) 23 (!) 22 (!) 24   Temp:  98.1 F (36.7 C)  98.2 F (36.8 C)  TempSrc:  Oral  Oral  SpO2: 97% 96%  94%  Weight:  51.1 kg (112 lb 10.5 oz)    Height:            Vitals:   05/01/17 2200 05/02/17 0043 05/02/17 0351 05/02/17 0700  BP: (!) 109/58 96/68 (!) 108/58 (!) 102/56  Pulse: (!) 51   68  Resp: (!) 23 (!) 22 (!) 24   Temp:  98.1 F (36.7 C)  98.2 F (36.8 C)  TempSrc:  Oral  Oral  SpO2: 97% 96%  94%  Weight:  51.1 kg (112 lb 10.5 oz)    Height:       Constitutional: NAD, calm, comfortable Eyes: PERRL, lids and conjunctivae normal ENMT: Mucous membranes are moist. Posterior pharynx clear of any exudate or lesions.Normal dentition.  Neck: normal, supple, no masses, no thyromegaly Respiratory: clear to auscultation bilaterally, no wheezing, no crackles. Normal respiratory effort. No accessory muscle use.  Cardiovascular: Regular rate and rhythm, no murmurs / rubs / gallops. No extremity edema. 2+ pedal pulses. No carotid bruits.  Abdomen: no tenderness, no masses palpated. No hepatosplenomegaly. Bowel sounds positive.  Musculoskeletal: no clubbing / cyanosis. No joint deformity upper and lower extremities. Good ROM, no contractures. Normal muscle tone.  Skin: no rashes, lesions, ulcers. No induration Neurologic: CN 2-12 grossly intact. Sensation intact, DTR normal. Strength 5/5 in all 4.  Psychiatric: Normal judgment and insight. Alert and oriented x 3. Normal mood.     Labs on Admission: I have personally reviewed following labs and imaging studies  CBC:  Recent Labs Lab 05/01/17 1307 05/02/17 0554  WBC 21.3* 13.5*  HGB 15.8* 13.8  HCT 45.7 40.4  MCV 90.9 89.0  PLT  206 180    Basic Metabolic Panel:  Recent Labs Lab 05/01/17 1307 05/02/17 0554  NA 139 135  K 4.2 3.0*  CL 103 102  CO2 26 25  GLUCOSE 130* 100*  BUN 9 9  CREATININE 0.82 0.75  CALCIUM 9.7 9.2    GFR: Estimated Creatinine Clearance: 59.6 mL/min (by C-G formula based on SCr of 0.75 mg/dL).  Liver Function Tests:  Recent Labs Lab 05/01/17 1307  AST 70*  ALT 19  ALKPHOS 115  BILITOT 0.7  PROT 7.6  ALBUMIN 4.3   No results for input(s): LIPASE, AMYLASE in the last 168 hours. No results for input(s): AMMONIA in the last 168 hours.  Coagulation Profile:  Recent Labs Lab 05/01/17 1400  INR 0.98    Recent Labs  05/01/17 1359  DDIMER 3.47*    Cardiac Enzymes:  Recent Labs Lab 05/01/17 1308  TROPONINI 8.99*    BNP (last 3 results) No results for input(s): PROBNP in the last 8760 hours.  HbA1C: No results for input(s): HGBA1C in the last 72 hours. No results found for: HGBA1C   CBG:  Recent  Labs Lab 05/01/17 1325  GLUCAP 114*    Lipid Profile:  Recent Labs  05/02/17 0554  CHOL 150  HDL 37*  LDLCALC 101*  TRIG 62  CHOLHDL 4.1    Thyroid Function Tests: No results for input(s): TSH, T4TOTAL, FREET4, T3FREE, THYROIDAB in the last 72 hours.  Anemia Panel: No results for input(s): VITAMINB12, FOLATE, FERRITIN, TIBC, IRON, RETICCTPCT in the last 72 hours.  Urine analysis: No results found for: COLORURINE, APPEARANCEUR, LABSPEC, PHURINE, GLUCOSEU, HGBUR, BILIRUBINUR, KETONESUR, PROTEINUR, UROBILINOGEN, NITRITE, LEUKOCYTESUR  Sepsis Labs: @LABRCNTIP (procalcitonin:4,lacticidven:4) )No results found for this or any previous visit (from the past 240 hour(s)).       Radiological Exams on Admission: Dg Chest 2 View  Result Date: 05/01/2017 CLINICAL DATA:  Chest pain EXAM: CHEST  2 VIEW COMPARISON:  None. FINDINGS: There is no edema or consolidation. The heart size and pulmonary vascularity are normal. No adenopathy. No pneumothorax.  There is mild degenerative change in the thoracic spine. IMPRESSION: No edema or consolidation. Electronically Signed   By: Bretta BangWilliam  Woodruff III M.D.   On: 05/01/2017 13:49   Dg Chest 2 View  Result Date: 05/01/2017 CLINICAL DATA:  Chest pain EXAM: CHEST  2 VIEW COMPARISON:  None. FINDINGS: There is no edema or consolidation. The heart size and pulmonary vascularity are normal. No adenopathy. No pneumothorax. There is mild degenerative change in the thoracic spine. IMPRESSION: No edema or consolidation. Electronically Signed   By: Bretta BangWilliam  Woodruff III M.D.   On: 05/01/2017 13:49      EKG: Independently reviewed. * QTC of 564  Assessment/Plan Principal Problem:   Chronic pain syndrome  consult was requested to convert the patient from methadone to other narcotics Methadone is being discontinued for QTC prolongation Based on morphine equivalents daily doses calculations, patient would be started on OxyContin 20 mg twice a day as well as oxycodone 10 mg every 4hours when necessary. This can be titrated up for better pain control Patient would need prescriptions for the above at the time of discharge Will benefit from outpatient pain clinic evaluation Suspect  QTC, Will improve after correction of potassium and magnesium  DVT prophylaxis:  Heparin     Code Status Orders     Full code     consults called: None  Family Communication: Admission, patients condition and plan of care including tests being ordered have been discussed with the patient  who indicates understanding and agree with the plan and Code Status  Admission status: inpatient    Disposition plan: Further plan will depend as patient's clinical course evolves and further radiologic and laboratory data become available. Likely home when stable   At the time of admission, it appears that the appropriate admission status for this patient is INPATIENT .Thisis judged to be reasonable and necessary in order to  provide the required intensity of service to ensure the patient's safetygiven thepresenting symptoms, physical exam findings, and initial radiographic and laboratory data in the context of their chronic comorbidities.   Richarda OverlieNayana Prisilla Kocsis MD [facility] Pager [phone]- [phone]  If 7PM-7AM, please contact night-coverage [email or web address] Password [email or web address]  05/02/2017, 10:42 AM

## 2017-05-02 NOTE — Progress Notes (Signed)
Progress Note  Patient Name: Sandra Salinas Date of Encounter: 05/02/2017  Primary Cardiologist: New to Dr. Okey Dupre  Subjective   Feeling well. No chest pain, sob or palpitations.   Inpatient Medications    Scheduled Meds: . carvedilol  3.125 mg Oral BID WC  . enoxaparin (LOVENOX) injection  40 mg Subcutaneous Q24H  . furosemide  20 mg Intravenous Q breakfast  . HYDROcodone-acetaminophen  1 tablet Oral Q8H  . sodium chloride flush  3 mL Intravenous Q12H   Continuous Infusions: . sodium chloride Stopped (05/01/17 1930)   PRN Meds: sodium chloride, acetaminophen, diazepam, ondansetron (ZOFRAN) IV, sodium chloride flush   Vital Signs    Vitals:   05/01/17 2200 05/02/17 0043 05/02/17 0351 05/02/17 0700  BP: (!) 109/58 96/68 (!) 108/58 (!) 102/56  Pulse: (!) 51   68  Resp: (!) 23 (!) 22 (!) 24   Temp:  98.1 F (36.7 C)  98.2 F (36.8 C)  TempSrc:  Oral  Oral  SpO2: 97% 96%  94%  Weight:  112 lb 10.5 oz (51.1 kg)    Height:        Intake/Output Summary (Last 24 hours) at 05/02/17 0752 Last data filed at 05/02/17 0230  Gross per 24 hour  Intake           110.83 ml  Output             2050 ml  Net         -1939.17 ml   Filed Weights   05/01/17 1308 05/02/17 0043  Weight: 115 lb (52.2 kg) 112 lb 10.5 oz (51.1 kg)    Telemetry    Sinus rhythm at rate of 50-60s - Personally Reviewed  ECG    SR with TWI in inferior lateral leads - Personally Reviewed  Physical Exam   GEN: thin frail woman in No acute distress.   Neck: No JVD Cardiac: RRR, no murmurs, rubs, or gallops. R radial cath site stable Respiratory: Clear to auscultation bilaterally. GI: Soft, nontender, non-distended  MS: No edema; No deformity. Neuro:  Nonfocal  Psych: Normal affect   Labs    Chemistry Recent Labs Lab 05/01/17 1307 05/02/17 0554  NA 139 135  K 4.2 3.0*  CL 103 102  CO2 26 25  GLUCOSE 130* 100*  BUN 9 9  CREATININE 0.82 0.75  CALCIUM 9.7 9.2  PROT 7.6  --   ALBUMIN  4.3  --   AST 70*  --   ALT 19  --   ALKPHOS 115  --   BILITOT 0.7  --   GFRNONAA >60 >60  GFRAA >60 >60  ANIONGAP 10 8     Hematology Recent Labs Lab 05/01/17 1307 05/02/17 0554  WBC 21.3* 13.5*  RBC 5.03 4.54  HGB 15.8* 13.8  HCT 45.7 40.4  MCV 90.9 89.0  MCH 31.4 30.4  MCHC 34.6 34.2  RDW 12.7 13.0  PLT 206 180    Cardiac Enzymes Recent Labs Lab 05/01/17 1308  TROPONINI 8.99*    DDimer  Recent Labs Lab 05/01/17 1359  DDIMER 3.47*     Radiology    Dg Chest 2 View  Result Date: 05/01/2017 CLINICAL DATA:  Chest pain EXAM: CHEST  2 VIEW COMPARISON:  None. FINDINGS: There is no edema or consolidation. The heart size and pulmonary vascularity are normal. No adenopathy. No pneumothorax. There is mild degenerative change in the thoracic spine. IMPRESSION: No edema or consolidation. Electronically Signed   By: Chrissie Noa  Margarita GrizzleWoodruff III M.D.   On: 05/01/2017 13:49    Cardiac Studies   Cath 05/01/17 Conclusions: 1. Mild, non-obstructive coronary artery disease involving the LAD. 2. Severely reduced LVEF with akinesis of the mid and apical segments. Appearance is suggestive of stress-induced cardiomyopathy. 3. Moderately elevated left ventricular filling pressure. 4. Right radial artery tortuosity with vasospasm during catheter manipulation.  Recommendations: 1. Medical therapy; will initiate carvedilol 3.125 mg BID tonight and wean off NTG infusion. 2. Add ACEI/ARB tomorrow, as blood pressure allows. 3. Begin gentle diuresis.  Patient Profile     61 y.o. female with history of reflex sympathetic dystrophy predominantly affected the left arm following carpal tunnel surgery many years ago, tobacco use, and reported "hole in the heart" as a child, who presented to [facility] ER with significant chest pain and shortness of breath for > 24 hours.  Trop 8.99, Hgb 15.8, Ddimer 3.47. EKG showed TWI in inferior and lateral leads. She was started on IV heparin and nitro and  transferred to the ED for further work up and cath.   Reports she has been taking multiple narcotic pain medications including methadone for many years. Given her prolonged QT, Held methadone for the time being. Consider pain consultation to assist with pain management.  Assessment & Plan    1. NSTEMI - Cath showed mild non obstructive CAD with severely reduced LVEF suggestive of stress induced cardiomyopathy.   2. Stress Induced Cardiomyopathy/Acute systolic CHF - By cath with elevated pressure. Started on Coreg and IV lasix. Net I & O 1.9L. BP soft low currently to add ACE/ARB. ? Need to echo.   3. Elevated d-dimer - Will review with MD for further work up.  3. Chronic pain syndrome - Held methadone for prolonged QT. Continue Norco  4. Prolonged QT - QT/QTc 564/567 ms this morning worsen from yesterday. Avoid QT prolonging agent.   5. Hypokalemia - Will give Kdur 40mg  x 2.   For questions or updates, please contact [facility] Please consult [email or web address] for contact info under Cardiology/STEMI.    Signed, Manson PasseyBhagat,Bhavinkumar, PA  05/02/2017, 7:52 AM   Patient seen, examined. Available data reviewed. Agree with findings, assessment, and plan as outlined by Chelsea AusVin Bhagat, PA-C. The patient is independently interviewed and examined. On exam, she is a thin, frail-appearing woman in no distress. Lung fields are clear. Heart is regular rate and rhythm with no murmur [place]. Right radial site is clear. Abdomen is soft and nontender. Extremities have no edema.  All available data is reviewed. 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 I personally reviewed her cardiac catheterization images and her ventriculogram is fairly typical of this. Her clinical presentation with elevated cardiac markers and chest pain also suggests this disease process.Fortunately she does not have obstructive coronary artery disease.  Current issues include low blood pressure and difficulty initiating  beta blocker/ACE inhibitor. We'll try to continue her on carvedilol at low dose. This should help in her recovery. I would avoid an ACE inhibitor because her blood pressure will not tolerate this. The other issue is that her QT interval is significantly prolonged. This is likely related to her acute event. However, with chronic methadone use we will need to change her to a different pain medication in the short-term because of her very prolonged QT interval. I suspect she will need a maintenance narcotic and something for breakthrough pain. Will ask for help from the medicine team and management.  We'll check an echocardiogram today. Follow clinically today. Anticipate hospital  discharge tomorrow if stable.  Tonny Bollman, M.D. 05/02/2017 9:25 AM

## 2017-05-03 DIAGNOSIS — G894 Chronic pain syndrome: Secondary | ICD-10-CM

## 2017-05-03 LAB — CBC
HEMATOCRIT: 42.4 % (ref 36.0–46.0)
HEMOGLOBIN: 15 g/dL (ref 12.0–15.0)
MCH: 31.4 pg (ref 26.0–34.0)
MCHC: 35.4 g/dL (ref 30.0–36.0)
MCV: 88.7 fL (ref 78.0–100.0)
Platelets: 178 10*3/uL (ref 150–400)
RBC: 4.78 MIL/uL (ref 3.87–5.11)
RDW: 12.6 % (ref 11.5–15.5)
WBC: 12 10*3/uL — AB (ref 4.0–10.5)

## 2017-05-03 LAB — MRSA PCR SCREENING: MRSA BY PCR: NEGATIVE

## 2017-05-03 MED ORDER — NOREPINEPHRINE BITARTRATE 1 MG/ML IV SOLN
5.0000 ug/min | INTRAVENOUS | Status: DC
  Administered 2017-05-03: 2 ug/min via INTRAVENOUS
  Filled 2017-05-03: qty 4

## 2017-05-03 MED ORDER — SODIUM CHLORIDE 0.9 % IV SOLN
Freq: Once | INTRAVENOUS | Status: AC
  Administered 2017-05-03: 200 mL via INTRAVENOUS

## 2017-05-03 MED ORDER — SODIUM CHLORIDE 0.9 % IV SOLN: Freq: Once | INTRAVENOUS | Status: DC

## 2017-05-03 MED ORDER — SODIUM CHLORIDE 0.9 % IV BOLUS (SEPSIS): 200.0000 mL | Freq: Once | INTRAVENOUS | Status: AC

## 2017-05-03 MED ORDER — DOPAMINE-DEXTROSE 3.2-5 MG/ML-% IV SOLN
0.0000 ug/kg/min | INTRAVENOUS | Status: DC
  Administered 2017-05-03: 2.5 ug/kg/min via INTRAVENOUS
  Administered 2017-05-03: 2.505 ug/kg/min via INTRAVENOUS
  Administered 2017-05-03: 5 ug/kg/min via INTRAVENOUS
  Filled 2017-05-03: qty 250

## 2017-05-03 NOTE — Progress Notes (Addendum)
Progress Note  Patient Name: Sandra HolmsSusan S Salinas Date of Encounter: 05/03/2017  Primary Cardiologist: Dr End  Subjective   Pt with HA; no chest pain or dyspnea  Inpatient Medications    Scheduled Meds: . aspirin EC  81 mg Oral Daily  . diazepam  10 mg Oral QHS  . enoxaparin (LOVENOX) injection  40 mg Subcutaneous Q24H  . oxyCODONE  20 mg Oral Q12H  . sodium chloride flush  3 mL Intravenous Q12H   Continuous Infusions: . sodium chloride Stopped (05/01/17 1930)  . sodium chloride 200 mL (05/03/17 0634)  . DOPamine 5 mcg/kg/min (05/03/17 0600)  . sodium chloride 200 mL (05/03/17 0306)   PRN Meds: sodium chloride, acetaminophen, diazepam, morphine injection, ondansetron (ZOFRAN) IV, oxyCODONE, sodium chloride flush   Vital Signs    Vitals:   05/03/17 0551 05/03/17 0600 05/03/17 0630 05/03/17 0700  BP: (!) 98/58 (!) 98/58 (!) 93/46 (!) 66/42  Pulse: 83  74 68  Resp: 19 18 13    Temp:    98.1 F (36.7 C)  TempSrc:    Oral  SpO2: 92% 91% 92% 90%  Weight:      Height:        Intake/Output Summary (Last 24 hours) at 05/03/17 0727 Last data filed at 05/03/17 0634  Gross per 24 hour  Intake           901.68 ml  Output             1750 ml  Net          -848.32 ml   Filed Weights   05/01/17 1308 05/02/17 0043 05/03/17 0530  Weight: 52.2 kg (115 lb) 51.1 kg (112 lb 10.5 oz) 52 kg (114 lb 10.2 oz)    Telemetry    Sinus- Personally Reviewed   Physical Exam   GEN: No acute distress.   Neck: No JVD Cardiac: RRR, no murmurs, rubs, or gallops.  Respiratory: Clear to auscultation bilaterally. GI: Soft, nontender, non-distended  MS: No edema; No deformity. Radial cath site with no hematoma Neuro:  Nonfocal  Psych: Normal affect   Labs    Chemistry Recent Labs Lab 05/01/17 1307 05/02/17 0554 05/02/17 1104  NA 139 135 136  K 4.2 3.0* 3.6  CL 103 102 98*  CO2 26 25 29   GLUCOSE 130* 100* 88  BUN 9 9 11   CREATININE 0.82 0.75 0.90  CALCIUM 9.7 9.2 9.3  PROT  7.6  --   --   ALBUMIN 4.3  --   --   AST 70*  --   --   ALT 19  --   --   ALKPHOS 115  --   --   BILITOT 0.7  --   --   GFRNONAA >60 >60 >60  GFRAA >60 >60 >60  ANIONGAP 10 8 9      Hematology Recent Labs Lab 05/01/17 1307 05/02/17 0554 05/03/17 0502  WBC 21.3* 13.5* 12.0*  RBC 5.03 4.54 4.78  HGB 15.8* 13.8 15.0  HCT 45.7 40.4 42.4  MCV 90.9 89.0 88.7  MCH 31.4 30.4 31.4  MCHC 34.6 34.2 35.4  RDW 12.7 13.0 12.6  PLT 206 180 178    Cardiac Enzymes Recent Labs Lab 05/01/17 1308  TROPONINI 8.99*    DDimer  Recent Labs Lab 05/01/17 1359  DDIMER 3.47*     Radiology    Dg Chest 2 View  Result Date: 05/01/2017 CLINICAL DATA:  Chest pain EXAM: CHEST  2 VIEW COMPARISON:  None. FINDINGS: There is no edema or consolidation. The heart size and pulmonary vascularity are normal. No adenopathy. No pneumothorax. There is mild degenerative change in the thoracic spine. IMPRESSION: No edema or consolidation. Electronically Signed   By: Bretta Bang III M.D.   On: 05/01/2017 13:49    Patient Profile     61 y.o. female admitted with chest pain and ruled in for a non-ST elevation myocardial infarction. Cardiac catheterization revealed no obstructive coronary disease and probable stress-induced cardiomyopathy. Echocardiogram shows severe LV dysfunction and pattern consistent with stress-induced cardiomyopathy. Also with prolonged QT interval. Now with hypotension.  Assessment & Plan    1 stress-induced cardiomyopathy-cardiac catheterization reveals no obstructive coronary disease. Ventriculogram and echocardiogram consistent with takotsubo cardiomyopathy. We cannot add a beta blocker or ACE inhibitor cause of hypotension. Patient's systolic blood pressure is presently 80. However she is asymptomatic. We will continue with low-dose dopamine to maintain pressure. I will avoid further hydration given severity of LV dysfunction. Continue aspirin. Given hypotension we will transfer  to ICU. Monitor blood pressure closely. Patient will need follow-up echocardiogram in 3-4 weeks. Hopefully cardiomyopathy will resolve. Note patient has an elevated d-dimer likely secondary to acute cardiac event. She has no risk factors for pulmonary embolus and is not complaining of dyspnea.  2 prolonged QT interval-this is likely from her recent non-ST elevation myocardial infarction/ischemia. Should improve over time. Methadone on hold. Primary care managing pain medications.  3 chronic pain syndrome-pain medications per primary care.  For questions or updates, please contact [facility] Please consult [email or web address] for contact info under Cardiology/STEMI.      Signed, Olga Millers, MD  05/03/2017, 7:27 AM    Addendum-patient ambulated with no symptoms. Systolic blood pressure increased to 115 with activities. We therefore began weaning the dopamine that was initiated in early a.m. hours. However at 2.5 g her systolic blood pressure decreased to 70. I will discontinue her dopamine and instead treat with norepinephrine. We will wean as tolerated. Olga Millers, MD

## 2017-05-03 NOTE — Progress Notes (Signed)
Paged Dr. Armanda Magicraci Turner per her verbal order to notify prior to increasing dopamine to from 2.5 mcg/kg/min to 505mcg/kg/min.  patients blood pressure is 82/65, and discussed trends over the last the last 2 hours.  Dr. Mayford Knifeurner states to increase dopamine to 165mcg/kg/min, and give a 200 ml NS bolus.  Dr. Mayford Knifeurner states her plan if MAP/SBP do not improve she will advise levophed and transfer to ICU.   throughout the night patient continues to deny s/s hypotension.  Alert and oriented x 4. Will continue to monitor patient and report changes to cardiology service.

## 2017-05-03 NOTE — Progress Notes (Signed)
Hospitalist consult progress note  Sandra Salinas RUE:[id number] DOB: 06-11-1956 DOA: 05/01/2017 PCP: Samuel Jester, DO   LOS: 2 days   Brief Narrative / Interim history: 61 year old female with a history of reflex sympathetic dystrophy, or for left upper extremity secondary to carpal tunnel surgery many years ago, non-ST elevation MI, found to have mild nonobstructive coronary artery disease and severely reduced LVEF with akinesis, suggestive of stress-induced cardiomyopathy, with a peak troponin of 8.99, found to have QTC of 567 on her EKG. Patient is followed by Dr. Samuel Jester will the family medicine physician in [place], who has been prescribing her the methadone. Previously she used to follow up with the pain clinic in [place] which has since closed down.  Patient was admitted on cardiology service and we were asked to consult to convert patient from methadone to other pain medications  Assessment & Plan: Principal Problem:   Chronic pain syndrome Active Problems:   NSTEMI (non-ST elevated myocardial infarction) (HCC)   Chronic pain syndrome -Patient tells me she has been on methadone since 1989 for chronic left shoulder left arm pain -Based on morphine equivalents daily doses calculations, patient would be started on OxyContin 20 mg twice a day as well as oxycodone 10 mg every 4hours -Patient reports pain today similar to her chronic pain as if she were on methadone.  No further titration seems to be necessary at this point  Takatsubo cardiomyopathy -Per cardiology, now with depressed EF and persistent hypotension requiring dopamine infusion and ICU stay   DVT prophylaxis: Lovenox Code Status: Full code Family Communication: significant other at bedside Disposition Plan: per primary  Procedures:   2D echo Impressions: - Akinesis of the distal anterior/septal/inferior/inferolateral walls and entire apex; (consider takotsubo cardiomyopathy); overall severely reduced LV  sysfolic function; mild diastolic dysfunction; no obvious apical thrombus; mild TR with mildly elevated pulmonary pressure.  Cardiac catheterization Conclusions: 1. Mild, non-obstructive coronary artery disease involving the LAD. 2. Severely reduced LVEF with akinesis of the mid and apical segments. Appearance is suggestive of stress-induced cardiomyopathy. 3. Moderately elevated left ventricular filling pressure. 4. Right radial artery tortuosity with vasospasm during catheter manipulation.  Recommendations: 1. Medical therapy; will initiate carvedilol 3.125 mg BID tonight and wean off NTG infusion. 2. Add ACEI/ARB tomorrow, as blood pressure allows. 3. Begin gentle diuresis.  Antimicrobials:  None    Subjective: - no chest pain, shortness of breath, no abdominal pain, nausea or vomiting. Wants to go home   Objective: Vitals:   05/03/17 1030 05/03/17 1115 05/03/17 1130 05/03/17 1140  BP: 100/76 (!) 101/57 (!) 105/58   Pulse: 61 66 (!) 55   Resp:  16 13   Temp: 98.2 F (36.8 C)   97.7 F (36.5 C)  TempSrc: Oral   Oral  SpO2:  95% 97%   Weight: 53.7 kg (118 lb 6.2 oz)     Height: 5\' 3"  (1.6 m)       Intake/Output Summary (Last 24 hours) at 05/03/17 1209 Last data filed at 05/03/17 1207  Gross per 24 hour  Intake           923.84 ml  Output             1550 ml  Net          -626.16 ml   Filed Weights   05/02/17 0043 05/03/17 0530 05/03/17 1030  Weight: 51.1 kg (112 lb 10.5 oz) 52 kg (114 lb 10.2 oz) 53.7 kg (118 lb 6.2 oz)  Examination:  Constitutional: NAD ENMT: Mucous membranes are moist. No oropharyngeal exudates Respiratory: clear to auscultation bilaterally, no wheezing, no crackles. Normal respiratory effort.  Cardiovascular: Regular rate and rhythm, no murmurs / rubs / gallops. No LE edema.  Abdomen: no tenderness. Bowel sounds positive.  Skin: no rashes, lesions, ulcers. No induration Neurologic: non focal   Data Reviewed: I have independently  reviewed following labs and imaging studies  CBC:  Recent Labs Lab 05/01/17 1307 05/02/17 0554 05/03/17 0502  WBC 21.3* 13.5* 12.0*  HGB 15.8* 13.8 15.0  HCT 45.7 40.4 42.4  MCV 90.9 89.0 88.7  PLT 206 180 178   Basic Metabolic Panel:  Recent Labs Lab 05/01/17 1307 05/02/17 0554 05/02/17 1104  NA 139 135 136  K 4.2 3.0* 3.6  CL 103 102 98*  CO2 26 25 29   GLUCOSE 130* 100* 88  BUN 9 9 11   CREATININE 0.82 0.75 0.90  CALCIUM 9.7 9.2 9.3  MG  --   --  2.1   GFR: Estimated Creatinine Clearance: 54.3 mL/min (by C-G formula based on SCr of 0.9 mg/dL). Liver Function Tests:  Recent Labs Lab 05/01/17 1307  AST 70*  ALT 19  ALKPHOS 115  BILITOT 0.7  PROT 7.6  ALBUMIN 4.3   No results for input(s): LIPASE, AMYLASE in the last 168 hours. No results for input(s): AMMONIA in the last 168 hours. Coagulation Profile:  Recent Labs Lab 05/01/17 1400  INR 0.98   Cardiac Enzymes:  Recent Labs Lab 05/01/17 1308  TROPONINI 8.99*   BNP (last 3 results) No results for input(s): PROBNP in the last 8760 hours. HbA1C: No results for input(s): HGBA1C in the last 72 hours. CBG:  Recent Labs Lab 05/01/17 1325  GLUCAP 114*   Lipid Profile:  Recent Labs  05/02/17 0554  CHOL 150  HDL 37*  LDLCALC 101*  TRIG 62  CHOLHDL 4.1   Thyroid Function Tests: No results for input(s): TSH, T4TOTAL, FREET4, T3FREE, THYROIDAB in the last 72 hours. Anemia Panel: No results for input(s): VITAMINB12, FOLATE, FERRITIN, TIBC, IRON, RETICCTPCT in the last 72 hours. Urine analysis: No results found for: COLORURINE, APPEARANCEUR, LABSPEC, PHURINE, GLUCOSEU, HGBUR, BILIRUBINUR, KETONESUR, PROTEINUR, UROBILINOGEN, NITRITE, LEUKOCYTESUR Sepsis Labs: Invalid input(s): PROCALCITONIN, LACTICIDVEN  Recent Results (from the past 240 hour(s))  MRSA PCR Screening     Status: None   Collection Time: 05/03/17 10:23 AM  Result Value Ref Range Status   MRSA by PCR NEGATIVE NEGATIVE Final      Comment:        The GeneXpert MRSA Assay (FDA approved for NASAL specimens only), is one component of a comprehensive MRSA colonization surveillance program. It is not intended to diagnose MRSA infection nor to guide or monitor treatment for MRSA infections.       Radiology Studies: Dg Chest 2 View  Result Date: 05/01/2017 CLINICAL DATA:  Chest pain EXAM: CHEST  2 VIEW COMPARISON:  None. FINDINGS: There is no edema or consolidation. The heart size and pulmonary vascularity are normal. No adenopathy. No pneumothorax. There is mild degenerative change in the thoracic spine. IMPRESSION: No edema or consolidation. Electronically Signed   By: Bretta BangWilliam  Woodruff III M.D.   On: 05/01/2017 13:49     Scheduled Meds: . aspirin EC  81 mg Oral Daily  . diazepam  10 mg Oral QHS  . enoxaparin (LOVENOX) injection  40 mg Subcutaneous Q24H  . oxyCODONE  20 mg Oral Q12H  . sodium chloride flush  3 mL Intravenous  Q12H   Continuous Infusions: . sodium chloride Stopped (05/01/17 1930)  . norepinephrine (LEVOPHED) Adult infusion 2 mcg/min (05/03/17 1102)     Pamella Pert, MD, PhD [facility] Pager [phone]  If 7PM-7AM, please contact night-coverage [email or web address] Password [email or web address] 05/03/2017, 12:09 PM

## 2017-05-03 NOTE — Progress Notes (Signed)
Dr Mayford Knifeurner made aware of patient soft blood pressures. Pt asymptomatic. Instructed to notify her if pressures continue to decline or pt becomes symptomatic. Discussed EF and LVEDP obtained from cardiac catheterization done on 10/18. Will continue to monitor closely.

## 2017-05-03 NOTE — Progress Notes (Signed)
Notified dr Jens Somcrenshaw, Dopamine decreased, recheck of BP is 60-70 over 30-40, see new orders.  Will still transfer to ICU

## 2017-05-03 NOTE — Progress Notes (Signed)
CARDIAC REHAB PHASE I   PRE:  Rate/Rhythm: 66 SR  BP:  Lying: 85/54  Sitting: 86/46  Standing: 93/56        SaO2: 90 RA  MODE:  Ambulation: 500 ft   POST:  Rate/Rhythm: 75 SR c/ occ PVC  BP:  Sitting: 114/59         SaO2: 90 RA  Pt ambulated 500 ft on RA, IV, gait belt, assist x1, steady gait, tolerated well with no complaints other than chronic arm and hip pain. BP stable. Reviewed education, pt and husband state they have no questions regarding education at this time. Pt to bed after walk, call bell within reach.   [id number] Joylene GrapesEmily C Sheridan Hew, RN, BSN 05/03/2017 8:57 AM

## 2017-05-03 NOTE — Progress Notes (Signed)
Paged Dr. Mayford Knifeurner due to decreased responsiveness to dopamine.  Dr. Mayford Knifeurner quickly returned phone call, informed her at 0415 BP was 61/35, pt denies s/s hypotension.  Rechecked bp at 0420 manually 80/40 (both pressures checked on right arm). Dr. Mayford Knifeurner states she does not want to increase the dopamine at this time. while on the phone with Dr. Mayford Knifeurner  patients RN ambulated her to bedside commode, and blood pressure increased to 101/43.  Patient tolerated well, and Dr. Mayford Knifeurner inquires about patient output (950 ml urine during 7p-7a shift).  We discussed radial site has no issues at this time, and her cardiac cath did not involve intervention.  After reviewing echo Dr. Mayford Knifeurner states it is ok to give 200 ml of NS bolus over an hour if lungs are still clear.  Also states we will cancel coreg.  Verbal orders read back and verified.

## 2017-05-04 ENCOUNTER — Other Ambulatory Visit: Payer: Self-pay | Admitting: Cardiology

## 2017-05-04 DIAGNOSIS — I5181 Takotsubo syndrome: Secondary | ICD-10-CM

## 2017-05-04 LAB — BASIC METABOLIC PANEL
Anion gap: 8 (ref 5–15)
BUN: 17 mg/dL (ref 6–20)
CALCIUM: 8.8 mg/dL — AB (ref 8.9–10.3)
CO2: 25 mmol/L (ref 22–32)
CREATININE: 0.83 mg/dL (ref 0.44–1.00)
Chloride: 103 mmol/L (ref 101–111)
GFR calc Af Amer: >60 mL/min (ref >60)
Glucose, Bld: 92 mg/dL (ref 65–99)
Potassium: 4 mmol/L (ref 3.5–5.1)
SODIUM: 136 mmol/L (ref 135–145)

## 2017-05-04 LAB — CBC
HCT: 40.1 % (ref 36.0–46.0)
Hemoglobin: 13.7 g/dL (ref 12.0–15.0)
MCH: 31.1 pg (ref 26.0–34.0)
MCHC: 34.2 g/dL (ref 30.0–36.0)
MCV: 90.9 fL (ref 78.0–100.0)
PLATELETS: 167 10*3/uL (ref 150–400)
RBC: 4.41 MIL/uL (ref 3.87–5.11)
RDW: 12.7 % (ref 11.5–15.5)
WBC: 8.1 10*3/uL (ref 4.0–10.5)

## 2017-05-04 LAB — MAGNESIUM: MAGNESIUM: 2 mg/dL (ref 1.7–2.4)

## 2017-05-04 MED ORDER — OXYCODONE HCL 10 MG PO TABS: 10.0000 mg | ORAL_TABLET | ORAL | 0 refills | Status: DC | PRN

## 2017-05-04 MED ORDER — NALOXONE HCL 2 MG/2ML IJ SOSY: PREFILLED_SYRINGE | INTRAMUSCULAR | 0 refills | Status: AC

## 2017-05-04 MED ORDER — OXYCODONE HCL ER 20 MG PO T12A: 20.0000 mg | EXTENDED_RELEASE_TABLET | Freq: Two times a day (BID) | ORAL | 0 refills | Status: DC

## 2017-05-04 NOTE — Progress Notes (Addendum)
HOSPITALIST CONSULT PROGRESS NOTE  Sandra Salinas ZOX:[id number] DOB: 10/24/1955 DOA: 05/01/2017 PCP: Samuel Jester, DO  HPI 61 year old female with a history of reflex sympathetic dystrophy of the left upper extremity secondary to carpal tunnel surgery many years ago, currently being managed in the ICU for stress-induced cardiomyopathy.  Patient presented with chest pain and was found to have a troponin peak of 8.99 and cardiac cath done showed mild nonobstructive coronary artery disease and severely reduced LVEF with akinesis, consistent with Takatsubo cardiomyopathy.  Patient was also found to have a QTC of 567 on the EKG.  Hospitalist team was consulted by cardiology service for conversion of patient's methadone to other pain meds as patient's QTC was noted to be 567.  Patient was previously followed up at a pain clinic in [facility] which has since closed down.  Patient currently follows family medicine physician, currently was managing her methadone  Today patient reports feeling better, denies any significant pain including chest pain, denies any shortness of breath, denies any nausea, vomiting, dizziness.  Patient reports pain is controlled with current pain regimen.  Assessment/Plan: Principal Problem:   Chronic pain syndrome Active Problems:   NSTEMI (non-ST elevated myocardial infarction) (HCC)   Stress-induced cardiomyopathy  #Chronic pain syndrome Stable with new pain regimen upon discharge OxyContin 20 mg twice a day as well as oxycodone 10 mg every 4 hours(conversion of patients methadone dose) Pt educated on the signs and symptoms of overdose, pt verbalised understanding Pt also advised to follow up with a pain clinic as well as her PCP for further pain management Narcan will be ordered for patient, pharmacy will instruct pt how to use Due to prolonged QTc, methadone cannot be used for pain management  #Takatsubo cardiomyopathy Cardiac cath revealed mild nonobstructive CAD  and severely reduced LVEF with akinesis Patient noted to be hypotensive requiring low-dose pressors, currently weaned off and BP stable Cardiology on board, we will not add a beta-blocker or ACE inhibitor due to recent hypotension Consider as outpatient, repeat echo in 3-4 weeks  #Prolonged QTC Likely from an NSTEMI, versus methadone use Methadone to be discontinued upon discharge   Code Status: Full  Family Communication: None  Disposition Plan: As per cardiology team   Consultants:  Cardiology  Procedures:  Cardiac cath on 05/01/17  Antimicrobials:  None  DVT prophylaxis: Lovenox   Objective: Vitals:   05/04/17 0730 05/04/17 0800 05/04/17 0900 05/04/17 1145  BP:  112/61 131/78   Pulse:  (!) 56 66   Resp:  (!) 9 19   Temp: 97.8 F (36.6 C)   98.6 F (37 C)  TempSrc: Oral   Oral  SpO2:  99% 99%   Weight:      Height:        Intake/Output Summary (Last 24 hours) at 05/04/17 1204 Last data filed at 05/04/17 1100  Gross per 24 hour  Intake           528.72 ml  Output              625 ml  Net           -96.28 ml   Filed Weights   05/02/17 0043 05/03/17 0530 05/03/17 1030  Weight: 51.1 kg (112 lb 10.5 oz) 52 kg (114 lb 10.2 oz) 53.7 kg (118 lb 6.2 oz)    Exam:   General: Alert, awake, oriented x3  Cardiovascular: Heart sounds S1-S2 no added sounds  Respiratory: Clear to auscultation bilaterally  Abdomen: Soft, nontender,  nondistended  Musculoskeletal: No pedal edema bilaterally  Skin: Normal  Psychiatry: Normal mood   Data Reviewed: CBC:  Recent Labs Lab 05/01/17 1307 05/02/17 0554 05/03/17 0502 05/04/17 0336  WBC 21.3* 13.5* 12.0* 8.1  HGB 15.8* 13.8 15.0 13.7  HCT 45.7 40.4 42.4 40.1  MCV 90.9 89.0 88.7 90.9  PLT 206 180 178 167   Basic Metabolic Panel:  Recent Labs Lab 05/01/17 1307 05/02/17 0554 05/02/17 1104 05/04/17 0336  NA 139 135 136 136  K 4.2 3.0* 3.6 4.0  CL 103 102 98* 103  CO2 26 25 29 25   GLUCOSE 130*  100* 88 92  BUN 9 9 11 17   CREATININE 0.82 0.75 0.90 0.83  CALCIUM 9.7 9.2 9.3 8.8*  MG  --   --  2.1 2.0   GFR: Estimated Creatinine Clearance: 58.9 mL/min (by C-G formula based on SCr of 0.83 mg/dL). Liver Function Tests:  Recent Labs Lab 05/01/17 1307  AST 70*  ALT 19  ALKPHOS 115  BILITOT 0.7  PROT 7.6  ALBUMIN 4.3   No results for input(s): LIPASE, AMYLASE in the last 168 hours. No results for input(s): AMMONIA in the last 168 hours. Coagulation Profile:  Recent Labs Lab 05/01/17 1400  INR 0.98   Cardiac Enzymes:  Recent Labs Lab 05/01/17 1308  TROPONINI 8.99*   BNP (last 3 results) No results for input(s): PROBNP in the last 8760 hours. HbA1C: No results for input(s): HGBA1C in the last 72 hours. CBG:  Recent Labs Lab 05/01/17 1325  GLUCAP 114*   Lipid Profile:  Recent Labs  05/02/17 0554  CHOL 150  HDL 37*  LDLCALC 101*  TRIG 62  CHOLHDL 4.1   Thyroid Function Tests: No results for input(s): TSH, T4TOTAL, FREET4, T3FREE, THYROIDAB in the last 72 hours. Anemia Panel: No results for input(s): VITAMINB12, FOLATE, FERRITIN, TIBC, IRON, RETICCTPCT in the last 72 hours. Urine analysis: No results found for: COLORURINE, APPEARANCEUR, LABSPEC, PHURINE, GLUCOSEU, HGBUR, BILIRUBINUR, KETONESUR, PROTEINUR, UROBILINOGEN, NITRITE, LEUKOCYTESUR Sepsis Labs: @LABRCNTIP (procalcitonin:4,lacticidven:4)  ) Recent Results (from the past 240 hour(s))  MRSA PCR Screening     Status: None   Collection Time: 05/03/17 10:23 AM  Result Value Ref Range Status   MRSA by PCR NEGATIVE NEGATIVE Final    Comment:        The GeneXpert MRSA Assay (FDA approved for NASAL specimens only), is one component of a comprehensive MRSA colonization surveillance program. It is not intended to diagnose MRSA infection nor to guide or monitor treatment for MRSA infections.       Studies: No results found.  Scheduled Meds: . aspirin EC  81 mg Oral Daily  . diazepam   10 mg Oral QHS  . enoxaparin (LOVENOX) injection  40 mg Subcutaneous Q24H  . oxyCODONE  20 mg Oral Q12H  . sodium chloride flush  3 mL Intravenous Q12H    Continuous Infusions: . sodium chloride 250 mL (05/03/17 1100)  . norepinephrine (LEVOPHED) Adult infusion Stopped (05/03/17 2000)     LOS: 3 days     Briant CedarNkeiruka J Jamarl Pew, MD [facility] Pager [phone]  If 7PM-7AM, please contact night-coverage [email or web address] Password [email or web address] 05/04/2017, 12:04 PM

## 2017-05-04 NOTE — Progress Notes (Signed)
Progress Note  Patient Name: Sandra Salinas Date of Encounter: 05/04/2017  Primary Cardiologist: Dr End  Subjective   Pt with chronic left shoulder pain; no chest pain or dyspnea.  Inpatient Medications    Scheduled Meds: . aspirin EC  81 mg Oral Daily  . diazepam  10 mg Oral QHS  . enoxaparin (LOVENOX) injection  40 mg Subcutaneous Q24H  . oxyCODONE  20 mg Oral Q12H  . sodium chloride flush  3 mL Intravenous Q12H   Continuous Infusions: . sodium chloride 250 mL (05/03/17 1100)  . norepinephrine (LEVOPHED) Adult infusion Stopped (05/03/17 2000)   PRN Meds: sodium chloride, acetaminophen, diazepam, morphine injection, ondansetron (ZOFRAN) IV, oxyCODONE, sodium chloride flush   Vital Signs    Vitals:   05/04/17 0300 05/04/17 0400 05/04/17 0500 05/04/17 0600  BP: 113/63 130/86 (!) 97/49 (!) 96/54  Pulse: (!) 59 62 (!) 58 (!) 56  Resp: 12 11 14 13   Temp: 97.9 F (36.6 C)     TempSrc: Oral     SpO2: 99% 96% 96% 94%  Weight:      Height:        Intake/Output Summary (Last 24 hours) at 05/04/17 0724 Last data filed at 05/04/17 0336  Gross per 24 hour  Intake           558.13 ml  Output              825 ml  Net          -266.87 ml   Filed Weights   05/02/17 0043 05/03/17 0530 05/03/17 1030  Weight: 51.1 kg (112 lb 10.5 oz) 52 kg (114 lb 10.2 oz) 53.7 kg (118 lb 6.2 oz)    Telemetry    Sinus- Personally Reviewed   Physical Exam   GEN: WD/WN No acute distress.   Neck: No JVD, supple Cardiac: RRR Respiratory: Clear to auscultation bilaterally. No wheeze GI: Soft, nontender, non-distended, no masses MS: No edema Radial cath site with no hematoma Neuro:  Nonfocal, grossly intact   Labs    Chemistry  Recent Labs Lab 05/01/17 1307 05/02/17 0554 05/02/17 1104 05/04/17 0336  NA 139 135 136 136  K 4.2 3.0* 3.6 4.0  CL 103 102 98* 103  CO2 26 25 29 25   GLUCOSE 130* 100* 88 92  BUN 9 9 11 17   CREATININE 0.82 0.75 0.90 0.83  CALCIUM 9.7 9.2 9.3  8.8*  PROT 7.6  --   --   --   ALBUMIN 4.3  --   --   --   AST 70*  --   --   --   ALT 19  --   --   --   ALKPHOS 115  --   --   --   BILITOT 0.7  --   --   --   GFRNONAA >60 >60 >60 >60  GFRAA >60 >60 >60 >60  ANIONGAP 10 8 9 8      Hematology  Recent Labs Lab 05/02/17 0554 05/03/17 0502 05/04/17 0336  WBC 13.5* 12.0* 8.1  RBC 4.54 4.78 4.41  HGB 13.8 15.0 13.7  HCT 40.4 42.4 40.1  MCV 89.0 88.7 90.9  MCH 30.4 31.4 31.1  MCHC 34.2 35.4 34.2  RDW 13.0 12.6 12.7  PLT 180 178 167    Cardiac Enzymes  Recent Labs Lab 05/01/17 1308  TROPONINI 8.99*    DDimer   Recent Labs Lab 05/01/17 1359  DDIMER 3.47*  Radiology    No results found.  Patient Profile     61 y.o. female admitted with chest pain and ruled in for a non-ST elevation myocardial infarction. Cardiac catheterization revealed no obstructive coronary disease and probable stress-induced cardiomyopathy. Echocardiogram shows severe LV dysfunction and pattern consistent with stress-induced cardiomyopathy. Also with prolonged QT interval.   Assessment & Plan    1 stress-induced cardiomyopathy-cardiac catheterization revealed no obstructive coronary disease. Ventriculogram and echocardiogram consistent with takotsubo cardiomyopathy. Pt with hypotension yesterday requiring transient pressors; low dose norepinephrine weaned to off last PM and BP now improved. Will not add a beta blocker or ACE inhibitor because of recent hypotension; can consider as outpt. Patient will need follow-up echocardiogram in 3-4 weeks. Cardiomyopathy should improve if stress induced.   2 prolonged QT interval-this is likely from her recent non-ST elevation myocardial infarction/ischemia. Should improve over time. No ventricular ectopy on telemetry. Methadone on hold. Primary care managing pain medications.  3 chronic pain syndrome-pain medications per primary care.  Ambulate this AM; if stable, DC this PM and fu in [place] (TOC  appt one week); will need fu echo as outlined.  >30 min PA and physician time D2  For questions or updates, please contact [facility] Please consult [email or web address] for contact info under Cardiology/STEMI.      Signed, Olga MillersBrian Stellah Donovan, MD  05/04/2017, 7:24 AM

## 2017-05-04 NOTE — Discharge Summary (Signed)
Discharge Summary    Patient ID: Sandra Salinas,  MRN: [id number], DOB/AGE: [birth year] 61 y.o.  Admit date: 05/01/2017 Discharge date: 05/04/2017  Primary Care Provider: Samuel Jester Primary Cardiologist: Dr. Okey Dupre  Discharge Diagnoses    Principal Problem:   Chronic pain syndrome Active Problems:   NSTEMI (non-ST elevated myocardial infarction) (HCC)   Stress-induced cardiomyopathy   Allergies Allergies  Allergen Reactions  . Methadone Hcl Other (See Comments)    Per Dr. Earmon Phoenix note and conversation with patient this morning in my presence, patient is not to have methadone until approved by cardiology secondary to risk of further prolongation of QT interval.  Serious risk, including death, could result.  Patient acknowledged understanding.  Acknowledged not to use until approved by cardiology.  Spoke with pharmacist about this this evening, who suggested I list drug as allergy.  . Haloperidol And Related     Tongue Swelling     Diagnostic Studies/Procedures    LHC 05/01/17 Procedures   LEFT HEART CATH AND CORONARY ANGIOGRAPHY  Conclusion   Conclusions: 1. Mild, non-obstructive coronary artery disease involving the LAD. 2. Severely reduced LVEF with akinesis of the mid and apical segments. Appearance is suggestive of stress-induced cardiomyopathy. 3. Moderately elevated left ventricular filling pressure. 4. Right radial artery tortuosity with vasospasm during catheter manipulation.   2D Echo 05/02/17 Study Conclusions  - Left ventricle: The cavity size was normal. Wall thickness was   normal. Systolic function was severely reduced. The estimated   ejection fraction was in the range of 25% to 30%. There is   akinesis of the mid-apicalanteroseptal, inferolateral, inferior,   and apical myocardium. Doppler parameters are consistent with   abnormal left ventricular relaxation (grade 1 diastolic   dysfunction). Doppler parameters are consistent with high  ventricular filling pressure. - Pulmonary arteries: Systolic pressure was mildly increased. PA   peak pressure: 35 mm Hg (S).  Impressions:  - Akinesis of the distal anterior/septal/inferior/inferolateral   walls and entire apex; (consider takotsubo cardiomyopathy);   overall severely reduced LV sysfolic function; mild diastolic   dysfunction; no obvious apical thrombus; mild TR with mildly   elevated pulmonary pressure.   History of Present Illness     61 y/o female with a h/o chronic pain on methadone and no history of CAD presenting with > 24 hour of chest pain, abnormal EKG and elevated troponin, consistent with NSTEMI. EKG showed inferolateral T-wave inversions and troponin level peaked at 8.99. Pt admitted for [facility] and management of NSTEMI.  Hospital Course    Pt was started on IV heparin and sent to the [facility] cath lab for coronary angiography. She was noted to have normal coronary arteries.  Ventriculogram and echocardiogram consistent with takotsubo cardiomyopathy. EF 25-30%. Akinesis of the distal anterior/septal/inferior/inferolateral walls and entire apex was noted. Management with BB and ACE-I intially recommended, however pt unable to tolerate due to hypotension.   Post cath, she developed significant hypotension requiring transient pressor support. Low dose norepinephrine was ultimately weaned off and BP improved. Given her hypotension, it was decided not to initiate an ACE nor BB at this time. Addition of medical management will be considered at time of her hospital f/u, if BP remains stable. It has been recommended that she get a repeat echocardiogram in 3-4 weeks to reassess LVF.   In addition, she was also noted to have a prolonged QT interval, 567 ms. This was felt likely due to her recent NSTEMI/ischemia and perhaps from chronic methadone  use.  Her methadone was held. IM managed her chronic pain meds. She was observed on telemetry and had no ventricular ectopy.  Electrolytes were stable. It was felt that this would improve with time.   On 05/04/17, pt was seen and examined by Dr. Jens Som who felt that she was stable for discharge home. Pt ambulated w/o difficulty or symptoms. BP remained stable. Dr. Jens Som recommended TOC post hospital f/u in 1 week, which will be arranged. She will get a f/u 2D echo in 3-4 weeks. Cardiomyopathy should improve if stress induced.   IM to write Rx for pain meds. Methadone was discontinued on discharge orders.    Consultants: Internal Medicine - Pain control management    Discharge Vitals Blood pressure 105/68, pulse 71, temperature 98.6 F (37 C), temperature source Oral, resp. rate 17, height 5\' 3"  (1.6 m), weight 118 lb 6.2 oz (53.7 kg), SpO2 99 %.  Filed Weights   05/02/17 0043 05/03/17 0530 05/03/17 1030  Weight: 112 lb 10.5 oz (51.1 kg) 114 lb 10.2 oz (52 kg) 118 lb 6.2 oz (53.7 kg)    Labs & Radiologic Studies    CBC  Recent Labs  05/03/17 0502 05/04/17 0336  WBC 12.0* 8.1  HGB 15.0 13.7  HCT 42.4 40.1  MCV 88.7 90.9  PLT 178 167   Basic Metabolic Panel  Recent Labs  05/02/17 1104 05/04/17 0336  NA 136 136  K 3.6 4.0  CL 98* 103  CO2 29 25  GLUCOSE 88 92  BUN 11 17  CREATININE 0.90 0.83  CALCIUM 9.3 8.8*  MG 2.1 2.0   Liver Function Tests No results for input(s): AST, ALT, ALKPHOS, BILITOT, PROT, ALBUMIN in the last 72 hours. No results for input(s): LIPASE, AMYLASE in the last 72 hours. Cardiac Enzymes No results for input(s): CKTOTAL, CKMB, CKMBINDEX, TROPONINI in the last 72 hours. BNP Invalid input(s): POCBNP D-Dimer No results for input(s): DDIMER in the last 72 hours. Hemoglobin A1C No results for input(s): HGBA1C in the last 72 hours. Fasting Lipid Panel  Recent Labs  05/02/17 0554  CHOL 150  HDL 37*  LDLCALC 101*  TRIG 62  CHOLHDL 4.1   Thyroid Function Tests No results for input(s): TSH, T4TOTAL, T3FREE, THYROIDAB in the last 72 hours.  Invalid  input(s): FREET3 _____________  Dg Chest 2 View  Result Date: 05/01/2017 CLINICAL DATA:  Chest pain EXAM: CHEST  2 VIEW COMPARISON:  None. FINDINGS: There is no edema or consolidation. The heart size and pulmonary vascularity are normal. No adenopathy. No pneumothorax. There is mild degenerative change in the thoracic spine. IMPRESSION: No edema or consolidation. Electronically Signed   By: Bretta Bang III M.D.   On: 05/01/2017 13:49   Disposition   Pt is being discharged home today in good condition.  Follow-up Plans & Appointments    Follow-up Information    End, Cristal Deer, MD Follow up.   Specialty:  Cardiology Why:  our office will call you with a hospital follow-up visit with cardiology in 1 week Contact information: [address] [place] [address] [phone]          Discharge Instructions    Amb Referral to Cardiac Rehabilitation    Complete by:  As directed    Diagnosis:  NSTEMI   Diet - low sodium heart healthy    Complete by:  As directed    Increase activity slowly    Complete by:  As directed  Discharge Medications   Current Discharge Medication List    CONTINUE these medications which have NOT CHANGED   Details  HYDROcodone-acetaminophen (NORCO) 10-325 MG tablet Take 1 tablet by mouth every 8 (eight) hours. Refills: 0    Multiple Vitamins-Minerals (WOMENS 50+ MULTI VITAMIN/MIN PO) Take 1 tablet by mouth daily.      STOP taking these medications     methadone (DOLOPHINE) 10 MG tablet           Outstanding Labs/Studies   F/u 2D echo to reassess LVEF in 3-4 weeks (order has been placed)  Duration of Discharge Encounter   Greater than 30 minutes including physician time.  Signed, Robbie LisBrittainy Olando Willems PA-C 05/04/2017, 3:33 PM

## 2017-05-05 ENCOUNTER — Telehealth: Payer: Self-pay | Admitting: Internal Medicine

## 2017-05-06 NOTE — Progress Notes (Signed)
CARDIOLOGY OFFICE NOTE  Date:  05/07/2017    Sandra HolmsSusan S Bubeck Date of Birth: [birth year] Medical Record #[id number]  PCP:  Samuel JesterButler, Cynthia, DO  Cardiologist:  End   Chief Complaint  Patient presents with  . Cardiomyopathy    Post hospital visit - seen for Dr. Okey DupreEnd    History of Present Illness: Sandra Salinas is a 61 y.o. female who presents today for a post hospital visit. Seen for Dr. Okey DupreEnd.  She has a h/o chronic pain on methadone and no prior history of CAD.  Presented earlier this month with > 24 hour of chest pain, abnormal EKG and elevated troponin, consistent with NSTEMI. EKG showed inferolateral T-wave inversions and troponin level peaked at 8.99. [facility]  Pt was started on IV heparin and sent to the cath lab for coronary angiography. She was noted to have normal coronary arteries.  Ventriculogram and echocardiogram consistent with takotsubo cardiomyopathy. EF 25-30%. Akinesis of the distal anterior/septal/inferior/inferolateral walls and entire apex was noted. Management with BB and ACE-I intially recommended, however pt unable to tolerate due to hypotension.   Post cath, she developed significant hypotension requiring transient pressor support. Low dose norepinephrine was ultimately weaned off and BP improved. Given her hypotension, it was decided not to initiate an ACE nor BB at this time. Addition of medical management will be considered at time of her hospital f/u, if BP remains stable. It has been recommended that she get a repeat echocardiogram in 3-4 weeks to reassess LVF.    In addition, she was also noted to have a prolonged QT interval, 567 ms. This was felt likely due to her recent NSTEMI/ischemia and perhaps from chronic methadone use. Her methadone was held. IM managed her chronic pain meds. She was observed on telemetry and had no ventricular ectopy. Electrolytes were stable. It was felt that this would improve with time.     On 05/04/17, pt was seen and examined by Dr. Jens Somrenshaw who felt that she was stable for discharge home. Pt ambulated w/o difficulty or symptoms. BP remained stable. She will get a f/u 2D echo in 3-4 weeks. Cardiomyopathy should improve if stress induced.  IM was to write Rx for pain meds. Methadone was discontinued on discharge orders.   Comes in today. Here with her husband. Says she does not have enough pain medicines and that she is going to run out - this is why she moved her appointment up with this office - under the assumption that Dr. Okey DupreEnd would be prescribing. Has not seen her PCP yet. Apparently used to go the pain clinic - but sent back to PCP. Thinks aspirin is an NSAID. Asking what to take for pain. No chest pain. Smoking less - about 5 cigs per day. No swelling. Breathing is "ok". Not dizzy.   Past Medical History:  Diagnosis Date  . Carpal tunnel syndrome   . Heart murmur   . Migraine    "none in the 2000s" (05/01/2017)  . NSTEMI (non-ST elevated myocardial infarction) (HCC) 05/01/2017  . Reflex sympathetic dystrophy of left upper extremity   . Round hole 012/03/57   "in my heart; went away when I was 12"    Past Surgical History:  Procedure Laterality Date  . CARDIAC CATHETERIZATION  05/01/2017  . CARPAL TUNNEL RELEASE Left   . LEFT HEART CATH AND CORONARY ANGIOGRAPHY N/A 05/01/2017   Procedure: LEFT HEART CATH AND CORONARY ANGIOGRAPHY;  Surgeon: Yvonne KendallEnd, Christopher,  MD;  Location: [facility] INVASIVE CV LAB;  Service: Cardiovascular;  Laterality: N/A;  . RHINOPLASTY    . TONSILLECTOMY  1963  . TUBAL LIGATION    . VAGINAL HYSTERECTOMY       Medications: Current Meds  Medication Sig  . Multiple Vitamins-Minerals (WOMENS 50+ MULTI VITAMIN/MIN PO) Take 1 tablet by mouth daily.  . naloxone (NARCAN) 2 MG/2ML injection Inject under skin/muscle every 2-86mins as needed for excessive sedation/overdose. May repeat q1-2H  . oxyCODONE (OXYCONTIN) 20 mg 12 hr tablet Take 1 tablet  (20 mg total) by mouth every 12 (twelve) hours.  Marland Kitchen oxyCODONE 10 MG TABS Take 1 tablet (10 mg total) by mouth every 4 (four) hours as needed for moderate pain.     Allergies: Allergies  Allergen Reactions  . Methadone Hcl Other (See Comments)    Per Dr. Earmon Phoenix note and conversation with patient this morning in my presence, patient is not to have methadone until approved by cardiology secondary to risk of further prolongation of QT interval.  Serious risk, including death, could result.  Patient acknowledged understanding.  Acknowledged not to use until approved by cardiology.  Spoke with pharmacist about this this evening, who suggested I list drug as allergy.  . Haloperidol And Related     Tongue Swelling     Social History: The patient  reports that she has been smoking Cigarettes.  She has a 18.00 pack-year smoking history. She has never used smokeless tobacco. She reports that she drinks alcohol. She reports that she does not use drugs.   Family History: The patient's family history includes Hypertension in her father.   Review of Systems: Please see the history of present illness.   Otherwise, the review of systems is positive for none.   All other systems are reviewed and negative.   Physical Exam: VS:  BP 112/70   Pulse (!) 57   Ht 5\' 3"  (1.6 m)   Wt 116 lb 12.8 oz (53 kg)   SpO2 98%   BMI 20.69 kg/m  .  BMI Body mass index is 20.69 kg/m.  Wt Readings from Last 3 Encounters:  05/07/17 116 lb 12.8 oz (53 kg)  05/03/17 118 lb 6.2 oz (53.7 kg)    General: Chronically ill appearing. Looks older than her stated age. She is alert and in no acute distress.   HEENT: Normal.  Neck: Supple, no JVD, carotid bruits, or masses noted.  Cardiac: Regular rate and rhythm. No murmurs, rubs, or gallops. No edema.  Respiratory:  Lungs are clear to auscultation bilaterally with normal work of breathing.  GI: Soft and nontender.  MS: No deformity or atrophy. Gait and ROM intact.    Skin: Warm and dry. Color is normal.  Neuro:  Strength and sensation are intact and no gross focal deficits noted.  Psych: Alert, appropriate and with normal affect.   LABORATORY DATA:  EKG:  EKG is ordered today. This demonstrates NSR with diffuse ST and T wave changes. QT has improved.   Lab Results  Component Value Date   WBC 8.1 05/04/2017   HGB 13.7 05/04/2017   HCT 40.1 05/04/2017   PLT 167 05/04/2017   GLUCOSE 92 05/04/2017   CHOL 150 05/02/2017   TRIG 62 05/02/2017   HDL 37 (L) 05/02/2017   LDLCALC 101 (H) 05/02/2017   ALT 19 05/01/2017   AST 70 (H) 05/01/2017   NA 136 05/04/2017   K 4.0 05/04/2017   CL 103 05/04/2017   CREATININE 0.83  05/04/2017   BUN 17 05/04/2017   CO2 25 05/04/2017   INR 0.98 05/01/2017     BNP (last 3 results) No results for input(s): BNP in the last 8760 hours.  ProBNP (last 3 results) No results for input(s): PROBNP in the last 8760 hours.   Other Studies Reviewed Today: [facility] 05/01/17 Procedures   LEFT HEART CATH AND CORONARY ANGIOGRAPHY  Conclusion   Conclusions: 1. Mild, non-obstructive coronary artery disease involving the LAD. 2. Severely reduced LVEF with akinesis of the mid and apical segments. Appearance is suggestive of stress-induced cardiomyopathy. 3. Moderately elevated left ventricular filling pressure. 4. Right radial artery tortuosity with vasospasm during catheter manipulation.   2D Echo 05/02/17 Study Conclusions  - Left ventricle: The cavity size was normal. Wall thickness was normal. Systolic function was severely reduced. The estimated ejection fraction was in the range of 25% to 30%. There is akinesis of the mid-apicalanteroseptal, inferolateral, inferior, and apical myocardium. Doppler parameters are consistent with abnormal left ventricular relaxation (grade 1 diastolic dysfunction). Doppler parameters are consistent with high ventricular filling pressure. - Pulmonary arteries:  Systolic pressure was mildly increased. PA peak pressure: 35 mm Hg (S).  Impressions:  - Akinesis of the distal anterior/septal/inferior/inferolateral walls and entire apex; (consider takotsubo cardiomyopathy); overall severely reduced LV sysfolic function; mild diastolic dysfunction; no obvious apical thrombus; mild TR with mildly elevated pulmonary pressure.     Assessment/Plan:  1. Takotsubo CM - unable to be on cardiac medicines due to soft BP and low HR - do not think we can add this unfortunately today - will get echo updated in a couple of weeks with follow up with Dr. Okey Dupre.   2. Chronic pain syndrome -  Would discuss with her PCP about her pain medicines. Explained that I will not refill nor do I think Dr. Okey Dupre will fill - defer to PCP.   3. Normal coronaries  4. Prolonged QT - this is improved on EKG today. She is asking about resuming Methadone - I suspect the prolonged QT would recur. May need to consider other options.   Current medicines are reviewed with the patient today.  The patient does not have concerns regarding medicines other than what has been noted above.  The following changes have been made:  See above.  Labs/ tests ordered today include:    Orders Placed This Encounter  Procedures  . ECHOCARDIOGRAM LIMITED     Disposition:   FU with Dr. Okey Dupre after limited echo.   Patient is agreeable to this plan and will call if any problems develop in the interim.   SignedNorma Fredrickson, NP  05/07/2017 3:38 PM  [facility] [address] [place], [place]  [address] Phone: [phone] Fax: [phone]

## 2017-05-07 ENCOUNTER — Ambulatory Visit (INDEPENDENT_AMBULATORY_CARE_PROVIDER_SITE_OTHER): Payer: 59 | Admitting: Nurse Practitioner

## 2017-05-07 ENCOUNTER — Encounter: Payer: Self-pay | Admitting: Nurse Practitioner

## 2017-05-07 VITALS — BP 112/70 | HR 57 | Ht 63.0 in | Wt 116.8 lb

## 2017-05-07 DIAGNOSIS — Z9889 Other specified postprocedural states: Secondary | ICD-10-CM

## 2017-05-07 DIAGNOSIS — I5181 Takotsubo syndrome: Secondary | ICD-10-CM

## 2017-05-07 NOTE — Patient Instructions (Addendum)
We will be checking the following labs today - NONE   Medication Instructions:    Continue with your current medicines.     Testing/Procedures To Be Arranged:  Limited echo in about 2 to 3 weeks  Follow-Up:   See Dr. Okey DupreEnd after the echocardiogram for discussion - same day if possible  Call your PCP regarding your pain medicines.     Other Special Instructions:   N/A    If you need a refill on your cardiac medications before your next appointment, please call your pharmacy.   Call the [facility] office at [phone] if you have any questions, problems or concerns.

## 2017-05-08 NOTE — Addendum Note (Signed)
Addended by: Vernard GamblesOLE, Sohana Austell S on: 05/08/2017 03:07 PM   Modules accepted: Orders

## 2017-05-23 ENCOUNTER — Encounter: Payer: Self-pay | Admitting: Internal Medicine

## 2017-05-23 ENCOUNTER — Ambulatory Visit: Payer: 59 | Admitting: Internal Medicine

## 2017-05-23 ENCOUNTER — Ambulatory Visit (HOSPITAL_COMMUNITY): Payer: 59 | Attending: Internal Medicine

## 2017-05-23 ENCOUNTER — Other Ambulatory Visit: Payer: Self-pay

## 2017-05-23 VITALS — BP 114/68 | HR 54 | Ht 63.0 in | Wt 118.0 lb

## 2017-05-23 DIAGNOSIS — R9431 Abnormal electrocardiogram [ECG] [EKG]: Secondary | ICD-10-CM

## 2017-05-23 DIAGNOSIS — I071 Rheumatic tricuspid insufficiency: Secondary | ICD-10-CM | POA: Diagnosis not present

## 2017-05-23 DIAGNOSIS — I5181 Takotsubo syndrome: Secondary | ICD-10-CM | POA: Diagnosis present

## 2017-05-23 DIAGNOSIS — R7401 Elevation of levels of liver transaminase levels: Secondary | ICD-10-CM

## 2017-05-23 DIAGNOSIS — R740 Nonspecific elevation of levels of transaminase and lactic acid dehydrogenase [LDH]: Secondary | ICD-10-CM | POA: Diagnosis not present

## 2017-05-23 DIAGNOSIS — I252 Old myocardial infarction: Secondary | ICD-10-CM | POA: Diagnosis not present

## 2017-05-23 DIAGNOSIS — M79602 Pain in left arm: Secondary | ICD-10-CM | POA: Diagnosis not present

## 2017-05-23 DIAGNOSIS — R001 Bradycardia, unspecified: Secondary | ICD-10-CM

## 2017-05-23 DIAGNOSIS — I2510 Atherosclerotic heart disease of native coronary artery without angina pectoris: Secondary | ICD-10-CM

## 2017-05-23 LAB — COMPREHENSIVE METABOLIC PANEL
ALBUMIN: 4.1 g/dL (ref 3.6–4.8)
ALT: 18 IU/L (ref 0–32)
AST: 20 IU/L (ref 0–40)
Albumin/Globulin Ratio: 1.5 (ref 1.2–2.2)
Alkaline Phosphatase: 139 IU/L — ABNORMAL HIGH (ref 39–117)
BUN / CREAT RATIO: 17 (ref 12–28)
BUN: 16 mg/dL (ref 8–27)
Bilirubin Total: <0.2 mg/dL (ref 0.0–1.2)
CALCIUM: 10 mg/dL (ref 8.7–10.3)
CO2: 28 mmol/L (ref 20–29)
CREATININE: 0.93 mg/dL (ref 0.57–1.00)
Chloride: 99 mmol/L (ref 96–106)
GFR calc Af Amer: 77 mL/min/{1.73_m2} (ref >59)
GFR, EST NON AFRICAN AMERICAN: 67 mL/min/{1.73_m2} (ref >59)
GLOBULIN, TOTAL: 2.7 g/dL (ref 1.5–4.5)
Glucose: 91 mg/dL (ref 65–99)
Potassium: 4.2 mmol/L (ref 3.5–5.2)
SODIUM: 140 mmol/L (ref 134–144)
Total Protein: 6.8 g/dL (ref 6.0–8.5)

## 2017-05-23 LAB — MAGNESIUM: Magnesium: 2.4 mg/dL — ABNORMAL HIGH (ref 1.6–2.3)

## 2017-05-23 LAB — TSH: TSH: 1.59 u[IU]/mL (ref 0.450–4.500)

## 2017-05-23 MED ORDER — LISINOPRIL 2.5 MG PO TABS: 2.5000 mg | ORAL_TABLET | Freq: Every day | ORAL | 1 refills | Status: DC

## 2017-05-23 NOTE — Patient Instructions (Addendum)
Medication Instructions:  Start lisinopril 2.5 mg daily.  Labwork: Your physician recommends that you have lab today--CMET/TSH/Magnesium level.  Your physician recommends that you have lab in about 2 weeks--BMET.   Testing/Procedures: None   Follow-Up: Your physician recommends that you schedule a follow-up appointment in: 3 months with Dr End.          If you need a refill on your cardiac medications before your next appointment, please call your pharmacy.

## 2017-05-23 NOTE — Progress Notes (Addendum)
Follow-up Outpatient Visit Date: 05/23/2017  Primary Care Provider: Octavio Graves, DO [address] [place] [address]  Chief Complaint: Follow-up stress-induced cardiomyopathy  HPI:  Sandra Salinas is a 61 y.o. 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 I met Sandra Salinas during her hospitalization in mid October acute chest pain. Catheterization showed nonobstructive coronary artery disease and severely reduced LVEF consistent with stress-induced cardiomyopathy. Hospitalization was complicated by hypotension precluding initiation of evidence-based heart failure therapy. QTc was also significantly prolonged in the setting of having been on chronic methadone for pain related to her RSD. She was last seen in our office by Truitt Merle on 05/07/17, at which time she was feeling better though her chronic pain was suboptimally controlled.  Today, Sandra Salinas reports that she is gradually feeling better. She has not had any chest pain or shortness of breath, though she is still somewhat fatigued with modest activity. She denies orthopnea, PND, edema, palpitations, and lightheadedness. Her chronic left arm pain is reasonably well controlled with OxyContin 20 mg twice a day, though she still requires as needed oxycodone frequently throughout the day. She would like to return to methadone, as OxyContin is also very expensive for her. She happily reports today that she has not smoked since her hospitalization last month.  --------------------------------------------------------------------------------------------------  Cardiovascular History & Procedures: Cardiovascular Problems:  Stress-induced cardiomyopathy  QT prolongation  Non-obstructive coronary artery disease  Risk Factors:  Known coronary artery disease and tobacco  use  Cath/PCI:  LHC (05/01/17): Mild, nonobstructive CAD involving the LAD. Severely reduced LVEF (less than 25%) with akinesis of the mid and apical segments, consistent with stress-induced cardiomyopathy. Moderately elevated LVEDP.  CV Surgery:  None  EP Procedures and Devices:  None   Non-Invasive Evaluation(s):  TTE (05/02/17): Normal LV size and wall thickness with LVEF of 25-30% with akinesis of the mid and apical myocardium. Grade 1 diastolic dysfunction with elevated filling pressure. Mild pulmonary hypertension.  Recent CV Pertinent Labs: Lab Results  Component Value Date   CHOL 150 05/02/2017   HDL 37 (L) 05/02/2017   LDLCALC 101 (H) 05/02/2017   TRIG 62 05/02/2017   CHOLHDL 4.1 05/02/2017   INR 0.98 05/01/2017   K 4.2 05/23/2017   MG 2.4 (H) 05/23/2017   BUN 16 05/23/2017   CREATININE 0.93 05/23/2017    Past medical and surgical history were reviewed and updated in EPIC.  Current Meds  Medication Sig  . Multiple Vitamins-Minerals (WOMENS 50+ MULTI VITAMIN/MIN PO) Take 1 tablet by mouth daily.  . naloxone (NARCAN) 2 MG/2ML injection Inject under skin/muscle every 2-55mns as needed for excessive sedation/overdose. May repeat q1-2H  . oxyCODONE (OXYCONTIN) 20 mg 12 hr tablet Take 1 tablet (20 mg total) by mouth every 12 (twelve) hours.  .Marland KitchenoxyCODONE 10 MG TABS Take 1 tablet (10 mg total) by mouth every 4 (four) hours as needed for moderate pain.    Allergies: Methadone hcl and Haloperidol and related  Social History   Socioeconomic History  . Marital status: Married    Spouse name: Not on file  . Number of children: Not on file  . Years of education: Not on file  . Highest education level: Not on file  Social Needs  . Financial resource strain: Not on file  . Food insecurity - worry: Not on file  . Food insecurity - inability:  Not on file  . Transportation needs - medical: Not on file  . Transportation needs - non-medical: Not on file  Occupational  History  . Not on file  Tobacco Use  . Smoking status: Former Smoker    Packs/day: 0.00    Types: Cigarettes    Last attempt to quit: 05/01/2017    Years since quitting: 0.0  . Smokeless tobacco: Never Used  Substance and Sexual Activity  . Alcohol use: Yes    Comment: 05/01/2017 "nothing since I was in my teens"  . Drug use: No  . Sexual activity: Not Currently  Other Topics Concern  . Not on file  Social History Narrative  . Not on file    Family History  Problem Relation Age of Onset  . Hypertension Father     Review of Systems: A 12-system review of systems was performed and was negative except as noted in the HPI.  --------------------------------------------------------------------------------------------------  Physical Exam: BP 114/68   Pulse (!) 54   Ht _0  (1.6 m)   Wt 118 lb (53.5 kg)   BMI 20.90 kg/m   General:  Thin woman appearing older than her stated age, seated comfortably in the exam room. She is accompanied by her husband. HEENT: No conjunctival pallor or scleral icterus. Moist mucous membranes.  OP clear. Neck: Supple without lymphadenopathy, thyromegaly, JVD, or HJR. Lungs: Normal work of breathing. Clear to auscultation bilaterally without wheezes or crackles. Heart: Regular rate and rhythm without murmurs, rubs, or gallops. Non-displaced PMI. Abd: Bowel sounds present. Soft, NT/ND without hepatosplenomegaly Ext: No lower extremity edema. Radial, PT, and DP pulses are 2+ bilaterally. Right radial arteriotomy is well-healed. Skin: Warm and dry without rash.  EKG:  Sinus bradycardia (heart rate 48 bpm) with prolonged QT and diffuse T-wave inversions.  Lab Results  Component Value Date   WBC 8.1 05/04/2017   HGB 13.7 05/04/2017   HCT 40.1 05/04/2017   MCV 90.9 05/04/2017   PLT 167 05/04/2017    Lab Results  Component Value Date   NA 140 05/23/2017   K 4.2 05/23/2017   CL 99 05/23/2017   CO2 28 05/23/2017   BUN 16 05/23/2017    CREATININE 0.93 05/23/2017   GLUCOSE 91 05/23/2017   ALT 18 05/23/2017    Lab Results  Component Value Date   CHOL 150 05/02/2017   HDL 37 (L) 05/02/2017   LDLCALC 101 (H) 05/02/2017   TRIG 62 05/02/2017   CHOLHDL 4.1 05/02/2017    --------------------------------------------------------------------------------------------------  ASSESSMENT AND PLAN: Stress-induced cardiomyopathy Symptomatically, Sandra Salinas has improved since her hospitalization. She appears euvolemic today with NYHA class II-III symptoms. Limited echocardiogram today was personally reviewed (formal report pending). Her LVEF still appears moderately reduced but improved compared with her hospitalization last month. Her EKG shows diffuse T-wave inversions and QT prolongation, which can be seen with stress-induced cardiomyopathy. She is currently not on evidence based heart failure therapy due to bradycardia and hypotension at the time of her hospitalization. Her low resting heart rate today still precludes addition of a beta blocker. However, I think it would be reasonable to add low-dose lisinopril (2.5 mg daily). I will check a CMP today and repeat a BMP in about 2 weeks.  QT prolongation QT is still prolonged despite holding methadone. Some of this may be related to her recovering stress-induced cardiomyopathy. I recommend that she continue to avoid all QT prolonging medications, including methadone. I will recheck her chemistries and electrolytes, particularly potassium and magnesium, today.  Sinus bradycardia HR still low, though the patient asymptomatic. She is not on any medications that would lower her heart rate. I will check a TSH today.  Transaminitis Mildly elevated AST noted at the time of her hospitalization last month. I will repeat liver function tests today.  Nonobstructive coronary artery disease Mild plaquing of the LAD noted on catheterization. Continue with primary prevention. Patient is currently  not on a statin; recheck LFTs today and readdress statin therapy at follow-up.  Left arm pain Chronic problem for the patient. She should continue her current regimen and speak with her PCP about alternatives if OxyContin is cost prohibitive. As above, I recommend continued avoidance of methadone in the setting of QT prolongation.  Follow-up: Return to clinic in 3 months.  Nelva Bush, MD 05/24/2017 10:12 AM

## 2017-05-24 ENCOUNTER — Encounter: Payer: Self-pay | Admitting: Internal Medicine

## 2017-05-24 DIAGNOSIS — I2510 Atherosclerotic heart disease of native coronary artery without angina pectoris: Secondary | ICD-10-CM | POA: Insufficient documentation

## 2017-05-24 DIAGNOSIS — R7401 Elevation of levels of liver transaminase levels: Secondary | ICD-10-CM | POA: Insufficient documentation

## 2017-05-24 DIAGNOSIS — R001 Bradycardia, unspecified: Secondary | ICD-10-CM | POA: Insufficient documentation

## 2017-05-24 DIAGNOSIS — M79602 Pain in left arm: Secondary | ICD-10-CM | POA: Insufficient documentation

## 2017-05-24 DIAGNOSIS — R740 Nonspecific elevation of levels of transaminase and lactic acid dehydrogenase [LDH]: Secondary | ICD-10-CM

## 2017-05-24 DIAGNOSIS — R9431 Abnormal electrocardiogram [ECG] [EKG]: Secondary | ICD-10-CM | POA: Insufficient documentation

## 2017-06-10 ENCOUNTER — Other Ambulatory Visit: Payer: 59

## 2017-06-10 DIAGNOSIS — I5181 Takotsubo syndrome: Secondary | ICD-10-CM

## 2017-06-11 LAB — BASIC METABOLIC PANEL
BUN/Creatinine Ratio: 16 (ref 12–28)
BUN: 13 mg/dL (ref 8–27)
CHLORIDE: 102 mmol/L (ref 96–106)
CO2: 26 mmol/L (ref 20–29)
Calcium: 10 mg/dL (ref 8.7–10.3)
Creatinine, Ser: 0.83 mg/dL (ref 0.57–1.00)
GFR calc Af Amer: 88 mL/min/{1.73_m2} (ref >59)
GFR calc non Af Amer: 76 mL/min/{1.73_m2} (ref >59)
GLUCOSE: 96 mg/dL (ref 65–99)
POTASSIUM: 4.8 mmol/L (ref 3.5–5.2)
SODIUM: 144 mmol/L (ref 134–144)

## 2017-07-14 NOTE — Telephone Encounter (Signed)
error 

## 2017-08-21 ENCOUNTER — Ambulatory Visit: Payer: 59 | Admitting: Internal Medicine

## 2017-08-21 ENCOUNTER — Encounter: Payer: Self-pay | Admitting: Internal Medicine

## 2017-08-21 VITALS — BP 114/78 | HR 56 | Ht 63.0 in | Wt 119.8 lb

## 2017-08-21 DIAGNOSIS — R9431 Abnormal electrocardiogram [ECG] [EKG]: Secondary | ICD-10-CM | POA: Diagnosis not present

## 2017-08-21 DIAGNOSIS — I5181 Takotsubo syndrome: Secondary | ICD-10-CM | POA: Diagnosis not present

## 2017-08-21 DIAGNOSIS — I2510 Atherosclerotic heart disease of native coronary artery without angina pectoris: Secondary | ICD-10-CM

## 2017-08-21 MED ORDER — ATORVASTATIN CALCIUM 10 MG PO TABS: 10.0000 mg | ORAL_TABLET | Freq: Every day | ORAL | 3 refills | Status: DC

## 2017-08-21 NOTE — Progress Notes (Signed)
Follow-up Outpatient Visit Date: 08/21/2017  Primary Care Provider: Samuel Salinas, Cynthia, DO [address] [place] [id number]  Chief Complaint: Follow-up cardiomyopathy  HPI:  [name] is a 62 y.o. 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  I last saw her in early November after preceding hospitalization for chest pain and shortness of breath.  Catheterization was notable for nonobstructive coronary artery disease with severely reduced LVEF consistent with stress-induced cardiomyopathy.  [name] reported gradual improvement in her symptoms after leaving the hospital, though she remained somewhat fatigued.  Today, [name] reports that she has continued to improve since our last visit. She denies chest pain, shortness of breath, palpitations, lightheadedness, and edema. Her left arm pain due to RSD is reasonably well-controlled with oxycodone 15 mg Q4-6 hours prn. [name] is trying to walk at least a mile every day.  --------------------------------------------------------------------------------------------------  Cardiovascular History & Procedures: Cardiovascular Problems:  Stress-induced cardiomyopathy  QT prolongation  Non-obstructive coronary artery disease  Risk Factors:  Known coronary artery disease and tobacco use  Cath/PCI:  LHC (05/01/17): Mild, nonobstructive CAD involving the LAD. Severely reduced LVEF (less than 25%) with akinesis of the mid and apical segments, consistent with stress-induced cardiomyopathy. Moderately elevated LVEDP.  CV Surgery:  None  EP Procedures and Devices:  None   Non-Invasive Evaluation(s):  TTE (05/02/17): Normal LV size and wall thickness with LVEF of 25-30% with akinesis of the mid and apical myocardium. Grade 1 diastolic dysfunction with elevated  filling pressure. Mild pulmonary hypertension.  Recent CV Pertinent Labs: Lab Results  Component Value Date   CHOL 150 05/02/2017   HDL 37 (L) 05/02/2017   LDLCALC 101 (H) 05/02/2017   TRIG 62 05/02/2017   CHOLHDL 4.1 05/02/2017   INR 0.98 05/01/2017   K 4.8 06/10/2017   MG 2.4 (H) 05/23/2017   BUN 13 06/10/2017   CREATININE 0.83 06/10/2017    Past medical and surgical history were reviewed and updated in EPIC.  Current Meds  Medication Sig  . aspirin EC 81 MG tablet Take 81 mg by mouth daily.  Marland Kitchen. lisinopril (PRINIVIL,ZESTRIL) 2.5 MG tablet Take 1 tablet (2.5 mg total) daily by mouth.  . Multiple Vitamins-Minerals (WOMENS 50+ MULTI VITAMIN/MIN PO) Take 1 tablet by mouth daily.  . naloxone (NARCAN) 2 MG/2ML injection Inject under skin/muscle every 2-463mins as needed for excessive sedation/overdose. May repeat q1-2H  . oxyCODONE (ROXICODONE) 15 MG immediate release tablet Take 15 mg by mouth every 5 (five) hours as needed for pain.    Allergies: Methadone hcl and Haloperidol and related  Social History   Socioeconomic History  . Marital status: Married    Spouse name: Not on file  . Number of children: Not on file  . Years of education: Not on file  . Highest education level: Not on file  Social Needs  . Financial resource strain: Not on file  . Food insecurity - worry: Not on file  . Food insecurity - inability: Not on file  . Transportation needs - medical: Not on file  . Transportation needs - non-medical: Not on file  Occupational History  . Not on file  Tobacco Use  . Smoking status: Former Smoker    Packs/day: 0.00    Types: Cigarettes    Last attempt to quit: 05/01/2017    Years since quitting: 0.3  . Smokeless  tobacco: Never Used  Substance and Sexual Activity  . Alcohol use: Yes    Comment: 05/01/2017 "nothing since I was in my teens"  . Drug use: No  . Sexual activity: Not Currently  Other Topics Concern  . Not on file  Social History Narrative  .  Not on file    Family History  Problem Relation Age of Onset  . Hypertension Father     Review of Systems: A 12-system review of systems was performed and was negative except as noted in the HPI.  --------------------------------------------------------------------------------------------------  Physical Exam: BP 114/78   Pulse (!) 56   Ht 5\' 3"  (1.6 m)   Wt 119 lb 12.8 oz (54.3 kg)   SpO2 98%   BMI 21.22 kg/m   General:  Thin woman, seated comfortably in the exam room. HEENT: No conjunctival pallor or scleral icterus. Moist mucous membranes.  OP clear. Neck: Supple without lymphadenopathy, thyromegaly, JVD, or HJR. Lungs: Normal work of breathing. Clear to auscultation bilaterally without wheezes or crackles. Heart: Bradycardic but regular without murmurs, rubs, or gallops. Non-displaced PMI. Abd: Bowel sounds present. Soft, NT/ND without hepatosplenomegaly Ext: No lower extremity edema. Radial, PT, and DP pulses are 2+ bilaterally. Skin: Warm and dry without rash.  EKG:  Sinus bradycardia with non-specific ST changes and U-waves. QTc improved at 403 ms.  Lab Results  Component Value Date   WBC 8.1 05/04/2017   HGB 13.7 05/04/2017   HCT 40.1 05/04/2017   MCV 90.9 05/04/2017   PLT 167 05/04/2017    Lab Results  Component Value Date   NA 144 06/10/2017   K 4.8 06/10/2017   CL 102 06/10/2017   CO2 26 06/10/2017   BUN 13 06/10/2017   CREATININE 0.83 06/10/2017   GLUCOSE 96 06/10/2017   ALT 18 05/23/2017    Lab Results  Component Value Date   CHOL 150 05/02/2017   HDL 37 (L) 05/02/2017   LDLCALC 101 (H) 05/02/2017   TRIG 62 05/02/2017   CHOLHDL 4.1 05/02/2017    --------------------------------------------------------------------------------------------------  ASSESSMENT AND PLAN: Stress-induced cardiomyopathy Ms. Rosman has continued to recover well from episode of stress-induced cardiomyopathy in October, 2018. She appears euvolemic and  well-compensated with NYHA class II-III symptoms. Repeat echo in November showed improved but still decreased LV contraction. We will continue lisinopril 2.5 mg daily. I am hesitant to increase this further or add a beta-blocker due to low baseline blood pressure and heart rate. We will plan to repeat a limited echo in ~3 months.  Non-obstructive coronary artery disease Mild to moderate, non-obstructive CAD noted on cath in 04/2017. We will continue low-dose aspirin and add atorvastatin 10 mg daily to prevent progression of disease. We will check a fasting lipid panel and ALT in ~3 months.  QT prolongation QT normal today. Recommend continued avoidance of methadone, if possible.  Abnormal EKG (U-wave) Noted on EKG today. We will check her potassium today.  Follow-up: Return to clinic in 4 months.  Yvonne Kendall, MD 08/21/2017 4:51 PM

## 2017-08-21 NOTE — Patient Instructions (Addendum)
Medication Instructions:  START Atorvastatin 10 mg by mouth daily -- If you need a refill on your cardiac medications before your next appointment, please call your pharmacy. --  Labwork: In 3 months Lipid Panel/ALT  Testing/Procedures: Your physician has requested that you have an echocardiogram. Echocardiography is a painless test that uses sound waves to create images of your heart. It provides your doctor with information about the size and shape of your heart and how well your heart's chambers and valves are working. This procedure takes approximately one hour. There are no restrictions for this procedure.  Please SCHEDULE TO BE DONE IN 3 MONTHS  Follow-Up: Your physician wants you to follow-up in: 4 MONTHS with Dr. Okey DupreEnd.     Thank you for choosing [facility]!!    Any Other Special Instructions Will Be Listed Below (If Applicable).

## 2017-08-22 ENCOUNTER — Other Ambulatory Visit: Payer: Self-pay | Admitting: Internal Medicine

## 2017-08-22 LAB — BASIC METABOLIC PANEL
BUN/Creatinine Ratio: 16 (ref 12–28)
BUN: 14 mg/dL (ref 8–27)
CHLORIDE: 101 mmol/L (ref 96–106)
CO2: 25 mmol/L (ref 20–29)
Calcium: 9.5 mg/dL (ref 8.7–10.3)
Creatinine, Ser: 0.89 mg/dL (ref 0.57–1.00)
GFR calc Af Amer: 81 mL/min/{1.73_m2} (ref >59)
GFR calc non Af Amer: 70 mL/min/{1.73_m2} (ref >59)
GLUCOSE: 93 mg/dL (ref 65–99)
POTASSIUM: 4.3 mmol/L (ref 3.5–5.2)
Sodium: 139 mmol/L (ref 134–144)

## 2017-08-22 NOTE — Telephone Encounter (Signed)
Please review for refill. Thanks!  

## 2017-11-20 ENCOUNTER — Ambulatory Visit (HOSPITAL_COMMUNITY): Payer: 59 | Attending: Internal Medicine

## 2017-11-20 ENCOUNTER — Other Ambulatory Visit: Payer: 59

## 2017-11-20 DIAGNOSIS — I5181 Takotsubo syndrome: Secondary | ICD-10-CM

## 2017-11-20 DIAGNOSIS — I340 Nonrheumatic mitral (valve) insufficiency: Secondary | ICD-10-CM | POA: Insufficient documentation

## 2017-11-20 DIAGNOSIS — I2510 Atherosclerotic heart disease of native coronary artery without angina pectoris: Secondary | ICD-10-CM

## 2017-11-20 LAB — LIPID PANEL
CHOLESTEROL TOTAL: 163 mg/dL (ref 100–199)
Chol/HDL Ratio: 3.4 ratio (ref 0.0–4.4)
HDL: 48 mg/dL (ref >39)
LDL Calculated: 99 mg/dL (ref 0–99)
TRIGLYCERIDES: 78 mg/dL (ref 0–149)
VLDL Cholesterol Cal: 16 mg/dL (ref 5–40)

## 2017-11-20 LAB — ALT: ALT: 17 IU/L (ref 0–32)

## 2017-11-24 ENCOUNTER — Other Ambulatory Visit: Payer: Self-pay

## 2017-11-24 MED ORDER — ATORVASTATIN CALCIUM 20 MG PO TABS: 20.0000 mg | ORAL_TABLET | Freq: Every day | ORAL | 3 refills | Status: AC

## 2017-12-02 ENCOUNTER — Telehealth: Payer: Self-pay | Admitting: Internal Medicine

## 2017-12-02 ENCOUNTER — Other Ambulatory Visit: Payer: Self-pay

## 2017-12-02 DIAGNOSIS — I25119 Atherosclerotic heart disease of native coronary artery with unspecified angina pectoris: Secondary | ICD-10-CM

## 2017-12-02 MED ORDER — LISINOPRIL 2.5 MG PO TABS: 2.5000 mg | ORAL_TABLET | Freq: Every day | ORAL | 3 refills | Status: AC

## 2017-12-02 NOTE — Telephone Encounter (Signed)
Spoke with patient, set up her lab appt and refilled Lisinopril.

## 2017-12-02 NOTE — Telephone Encounter (Signed)
New Message   Pt states she is returning call for nurse about scheduling labs. No orders in epic

## 2017-12-17 ENCOUNTER — Ambulatory Visit: Payer: 59 | Admitting: Internal Medicine

## 2018-01-01 ENCOUNTER — Ambulatory Visit: Payer: 59 | Admitting: Internal Medicine

## 2018-01-01 ENCOUNTER — Encounter: Payer: Self-pay | Admitting: Internal Medicine

## 2018-01-01 VITALS — BP 106/68 | HR 51 | Ht 63.0 in | Wt 129.0 lb

## 2018-01-01 DIAGNOSIS — R9431 Abnormal electrocardiogram [ECG] [EKG]: Secondary | ICD-10-CM

## 2018-01-01 DIAGNOSIS — I5181 Takotsubo syndrome: Secondary | ICD-10-CM | POA: Diagnosis not present

## 2018-01-01 DIAGNOSIS — G9050 Complex regional pain syndrome I, unspecified: Secondary | ICD-10-CM | POA: Diagnosis not present

## 2018-01-01 NOTE — Patient Instructions (Addendum)
Medication Instructions:  Your physician recommends that you continue on your current medications as directed. Please refer to the Current Medication list given to you today.  -- If you need a refill on your cardiac medications before your next appointment, please call your pharmacy. --  Labwork: None ordered  Testing/Procedures: None ordered  Follow-Up: Your physician wants you to follow-up in: 6 months with Dr. End.    You will receive a reminder letter in the mail two months in advance. If you don't receive a letter, please call our office to schedule the follow-up appointment.  Thank you for choosing [facility]!!    Any Other Special Instructions Will Be Listed Below (If Applicable).         

## 2018-01-01 NOTE — Progress Notes (Signed)
Follow-up Outpatient Visit Date: 01/01/2018  Primary Care Provider: Samuel Jester, DO [address] [name] [address]  Chief Complaint: Follow-up cardiomyopathy  HPI:  [name] is a 62 y.o. 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  I last saw her in February at which time she was continuing to improve.  She denied chest pain, shortness of breath, palpitations, lightheadedness, and edema at that time.  Repeat echo last month showed further improvement in LV function with EF of 45 to 50%.  Today, [name] feels well except for continued pain in the left arm related to RSD.  Pain is controlled by oxycodone, though she is concerned about potential long-term problems with continued oxycodone use.  She denies chest pain, shortness of breath, palpitations, lightheadedness, orthopnea, and edema.  She is trying to walk more.  She is tolerating low-dose lisinopril without side effects.  --------------------------------------------------------------------------------------------------  Cardiovascular History & Procedures: Cardiovascular Problems:  Stress-induced cardiomyopathy  QT prolongation  Non-obstructive coronary artery disease  Risk Factors:  Known coronary artery disease and tobacco use  Cath/PCI:  LHC (05/01/2017): Mild, nonobstructive CAD involving the LAD. Severely reduced LVEF (less than 25%) with akinesis of the mid and apical segments, consistent with stress-induced cardiomyopathy. Moderately elevated LVEDP.  CV Surgery:  None  EP Procedures and Devices:  None  Non-Invasive Evaluation(s):  TTE (11/20/2017): LVEF 45-50% with apical hypokinesis.  Mild mitral regurgitation.  Normal RV size and function.  Limited TTE (05/23/2017): Normal LV size and wall thickness.  LVEF 35-40% with global hypokinesis  and grade 1 diastolic dysfunction.  Trivial MR.  Moderate TR.  Moderate pulmonary hypertension.  Normal RV size and function.  TTE (05/02/2017): Normal LV size and wall thickness with LVEF of 25-30% with akinesis of the mid and apical myocardium. Grade 1 diastolic dysfunction with elevated filling pressure. Mild pulmonary hypertension.   Recent CV Pertinent Labs: Lab Results  Component Value Date   CHOL 163 11/20/2017   HDL 48 11/20/2017   LDLCALC 99 11/20/2017   TRIG 78 11/20/2017   CHOLHDL 3.4 11/20/2017   CHOLHDL 4.1 05/02/2017   INR 0.98 05/01/2017   K 4.3 08/21/2017   MG 2.4 (H) 05/23/2017   BUN 14 08/21/2017   CREATININE 0.89 08/21/2017    Past medical and surgical history were reviewed and updated in EPIC.  Current Meds  Medication Sig  . aspirin EC 81 MG tablet Take 81 mg by mouth daily.  Marland Kitchen atorvastatin (LIPITOR) 20 MG tablet Take 1 tablet (20 mg total) by mouth daily.  Marland Kitchen lisinopril (PRINIVIL,ZESTRIL) 2.5 MG tablet Take 1 tablet (2.5 mg total) by mouth daily.  . Multiple Vitamins-Minerals (WOMENS 50+ MULTI VITAMIN/MIN PO) Take 1 tablet by mouth daily.  . naloxone (NARCAN) 2 MG/2ML injection Inject under skin/muscle every 2-6mins as needed for excessive sedation/overdose. May repeat q1-2H  . oxyCODONE (ROXICODONE) 15 MG immediate release tablet Take 15 mg by mouth every 5 (five) hours as needed for pain.    Allergies: Methadone hcl and Haloperidol and related  Social History   Tobacco Use  . Smoking status: Former Smoker    Packs/day: 0.00    Types: Cigarettes    Last attempt to quit: 05/01/2017    Years since quitting: 0.6  . Smokeless tobacco: Never Used  Substance Use Topics  . Alcohol use: Yes    Comment: 05/01/2017 "nothing since  I was in my teens"  . Drug use: No    Family History  Problem Relation Age of Onset  . Hypertension Father     Review of Systems: A 12-system review of systems was performed and was negative except as noted in the  HPI.  --------------------------------------------------------------------------------------------------  Physical Exam: BP 106/68   Pulse (!) 51   Ht 5\' 3"  (1.6 m)   Wt 129 lb (58.5 kg)   SpO2 98%   BMI 22.85 kg/m   General:  NAD.  Accompanied by her husband. HEENT: No conjunctival pallor or scleral icterus. Moist mucous membranes.  OP clear. Neck: Supple without lymphadenopathy, thyromegaly, JVD, or HJR. Lungs: Normal work of breathing. Clear to auscultation bilaterally without wheezes or crackles. Heart: Regular rate and rhythm without murmurs, rubs, or gallops. Non-displaced PMI. Abd: Bowel sounds present. Soft, NT/ND without hepatosplenomegaly Ext: No lower extremity edema. Radial, PT, and DP pulses are 2+ bilaterally. Skin: Warm and dry without rash.  Lab Results  Component Value Date   WBC 8.1 05/04/2017   HGB 13.7 05/04/2017   HCT 40.1 05/04/2017   MCV 90.9 05/04/2017   PLT 167 05/04/2017    Lab Results  Component Value Date   NA 139 08/21/2017   K 4.3 08/21/2017   CL 101 08/21/2017   CO2 25 08/21/2017   BUN 14 08/21/2017   CREATININE 0.89 08/21/2017   GLUCOSE 93 08/21/2017   ALT 17 11/20/2017    Lab Results  Component Value Date   CHOL 163 11/20/2017   HDL 48 11/20/2017   LDLCALC 99 11/20/2017   TRIG 78 11/20/2017   CHOLHDL 3.4 11/20/2017    --------------------------------------------------------------------------------------------------  ASSESSMENT AND PLAN: Stress-induced cardiomyopathy No symptoms.  LVEF has gradually improved though not totally back to normal on most recent echo.  Bradycardia and borderline low blood pressure preclude addition of a beta-blocker or escalation of lisinopril.  We will continue her current regimen.  QT prolongation No EKG obtained today, though QTc was back to normal on last EKG in February.  Prolongation at time of admission in 05/2017 was likely a combination of stress-induced cardiomyopathy and methadone use.  I  recommend avoiding QT-prolonging medications, if possible.  However, if methadone or another QT-prolonging agent is started in the future, careful EKG monitoring of the QTc will need to be performed.  RSD Chronic pain involving the left arm is controlled with oxycodone.  I have recommended that Ms. Gayton f/u with Dr. Charm BargesButler and her pain specialist.  I favor avoiding methadone, as outlined above.  If no other reasonable alternative exists, the QTc will need to be closely monitored.  Follow-up: Return to clinic in 6 months.  Yvonne Kendallhristopher Roan Sawchuk, MD 01/01/2018 8:06 PM

## 2018-03-02 ENCOUNTER — Other Ambulatory Visit: Payer: 59

## 2018-05-06 IMAGING — DX DG CHEST 2V
2 series · 2 of 2 positions shown · non-contrast
Comparison: None.

CLINICAL DATA: Chest pain

EXAM:
CHEST  2 VIEW

[chest pa]
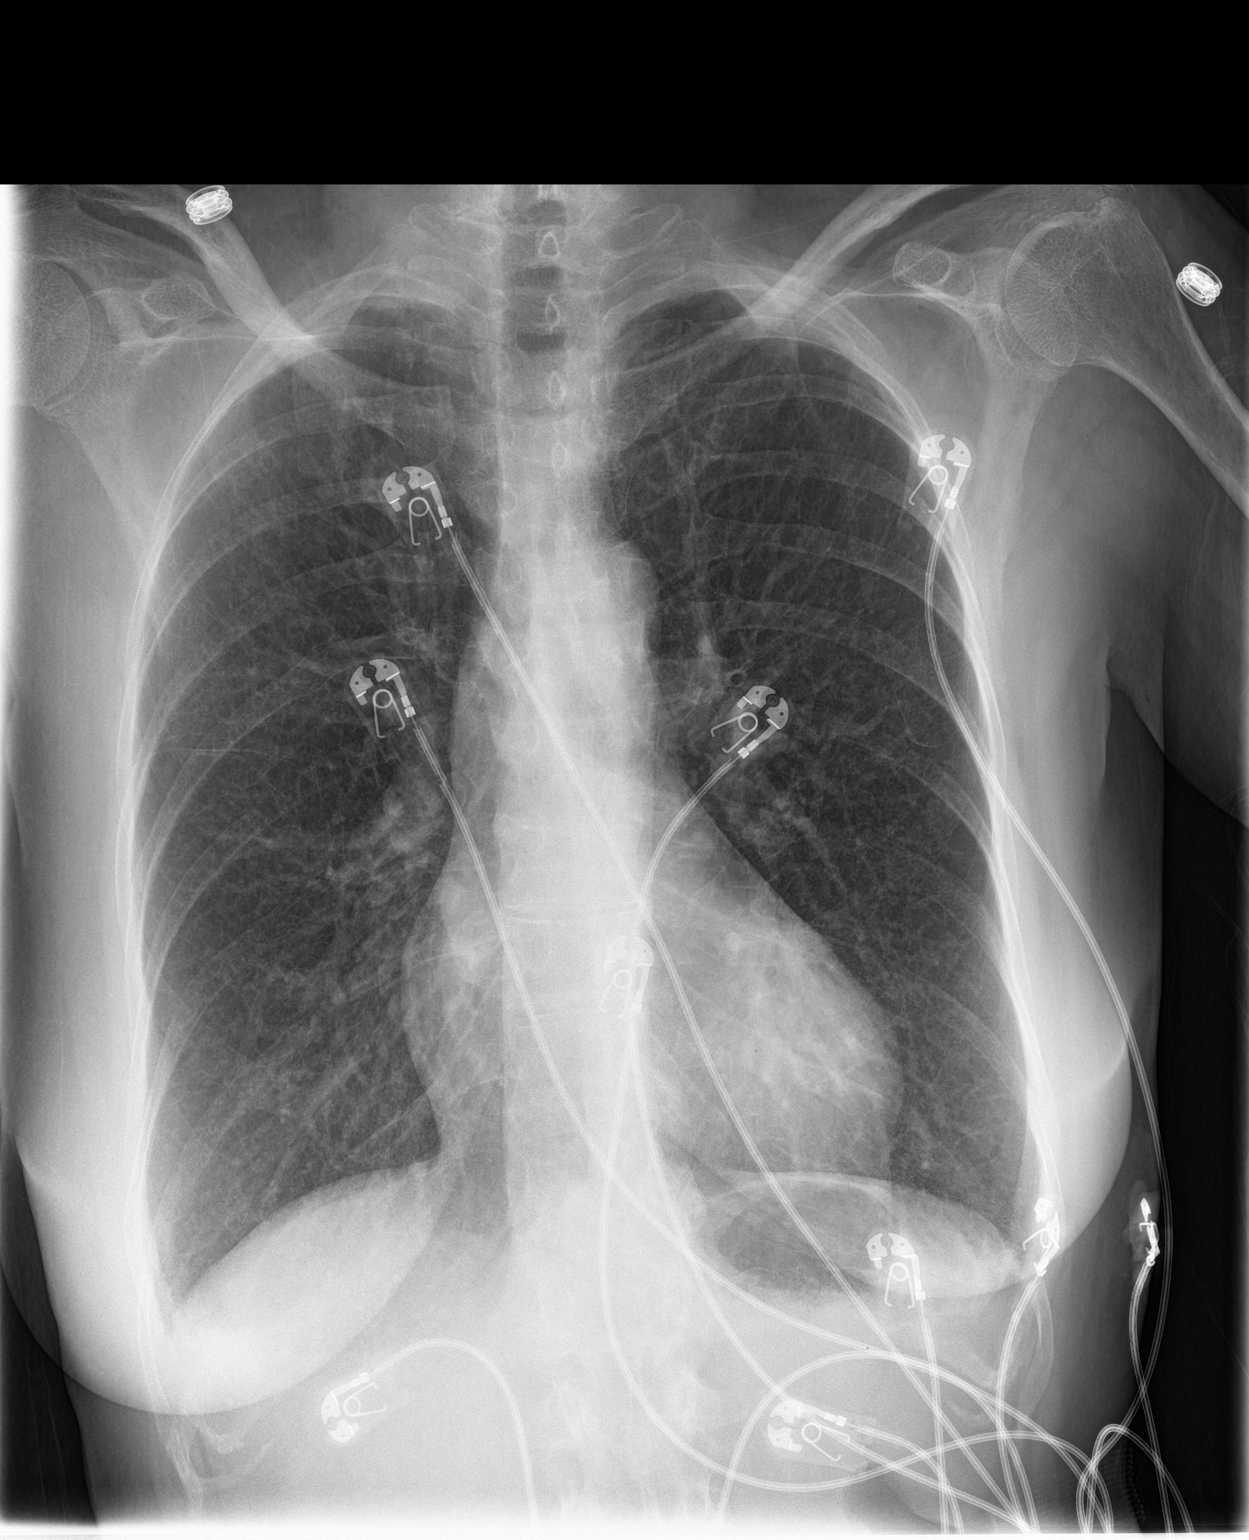

[chest lat]
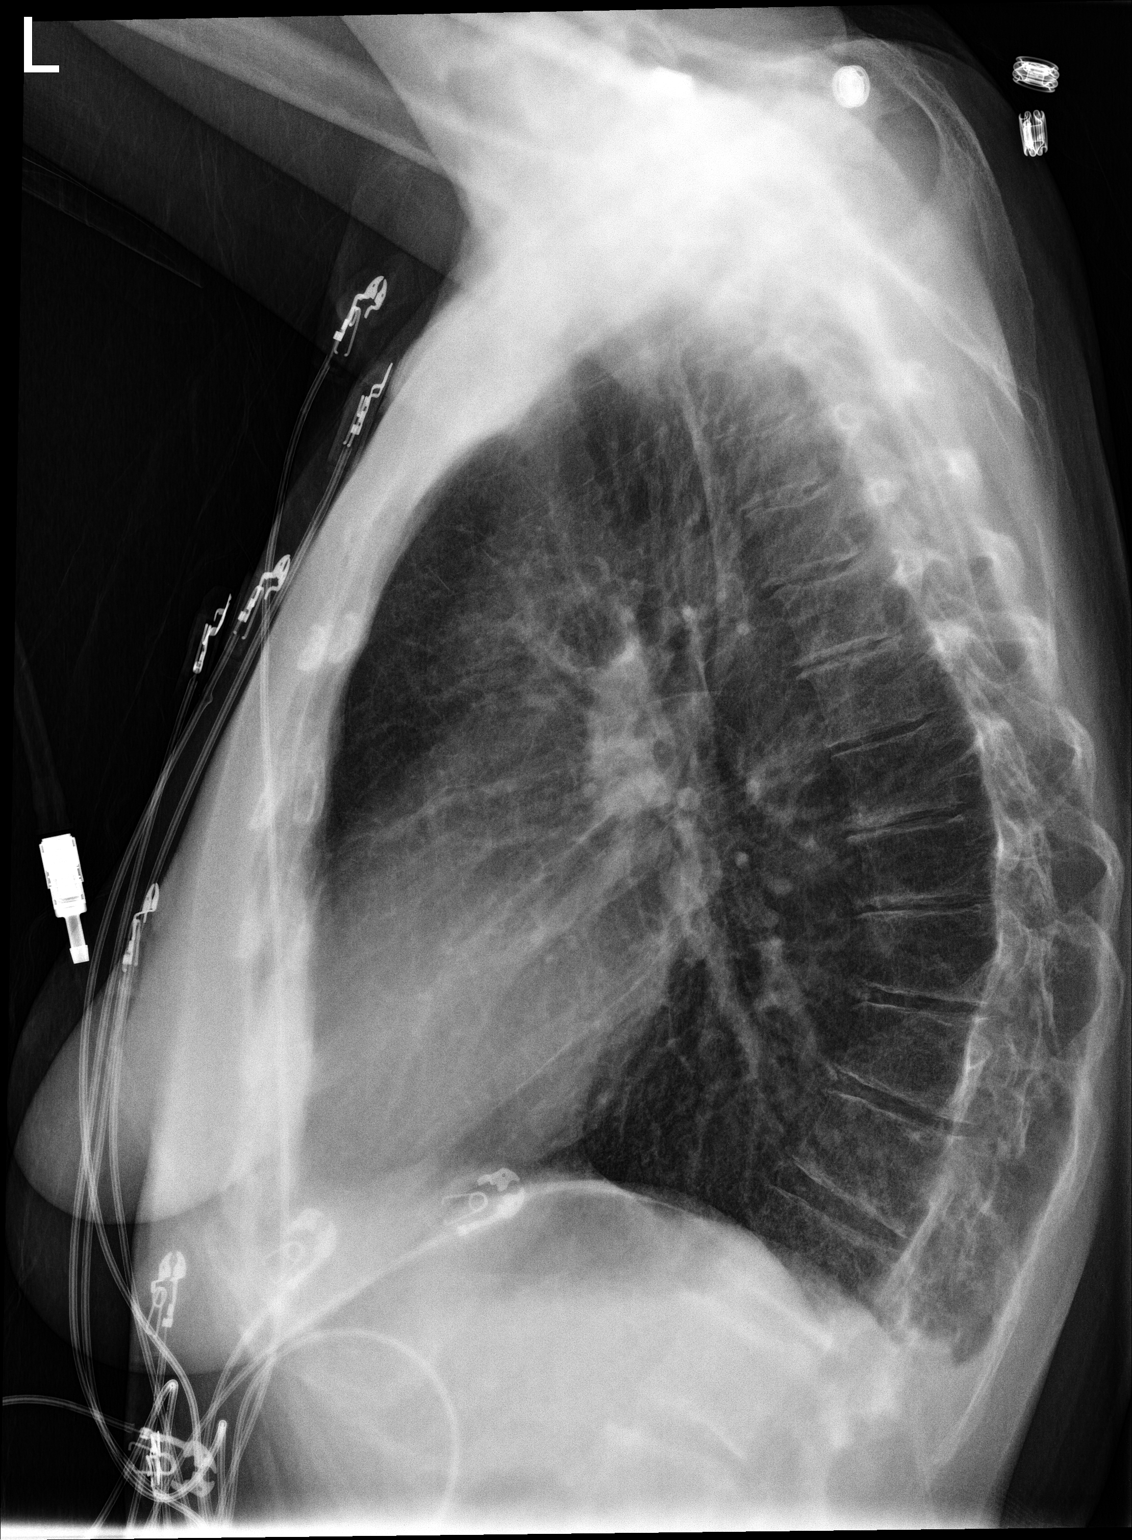

[2 of 2 positions shown; findings below may reference images not displayed]

FINDINGS: There is no edema or consolidation. The heart size and pulmonary
vascularity are normal. No adenopathy. No pneumothorax. There is
mild degenerative change in the thoracic spine.
IMPRESSION: No edema or consolidation.

## 2018-05-28 ENCOUNTER — Encounter: Payer: Self-pay | Admitting: Physician Assistant

## 2018-07-07 ENCOUNTER — Ambulatory Visit: Payer: 59 | Admitting: Physician Assistant
